# Patient Record
Sex: Female | Born: 1940 | Race: White | Hispanic: No | Marital: Married | Smoking: Never smoker
Health system: Southern US, Community
[De-identification: ages and names within clinical notes are randomized; demographics above are authoritative.]

## PROBLEM LIST (undated history)

## (undated) DIAGNOSIS — E785 Hyperlipidemia, unspecified: Secondary | ICD-10-CM

## (undated) DIAGNOSIS — I10 Essential (primary) hypertension: Secondary | ICD-10-CM

## (undated) HISTORY — PX: LAPAROSCOPIC HYSTERECTOMY: SHX1926

## (undated) HISTORY — DX: Hyperlipidemia, unspecified: E78.5

## (undated) HISTORY — DX: Essential (primary) hypertension: I10

## (undated) HISTORY — PX: TOTAL HIP ARTHROPLASTY: SHX124

---

## 2019-10-17 ENCOUNTER — Other Ambulatory Visit (HOSPITAL_COMMUNITY): Payer: Self-pay | Admitting: Emergency Medicine

## 2019-10-17 DIAGNOSIS — Z1231 Encounter for screening mammogram for malignant neoplasm of breast: Secondary | ICD-10-CM

## 2019-10-27 ENCOUNTER — Encounter: Payer: Self-pay | Admitting: Internal Medicine

## 2019-11-02 ENCOUNTER — Ambulatory Visit (HOSPITAL_COMMUNITY): Payer: Medicare HMO

## 2019-12-01 ENCOUNTER — Ambulatory Visit: Payer: Medicare HMO | Admitting: Internal Medicine

## 2019-12-01 ENCOUNTER — Telehealth: Payer: Self-pay | Admitting: *Deleted

## 2019-12-01 ENCOUNTER — Other Ambulatory Visit: Payer: Self-pay

## 2019-12-01 ENCOUNTER — Encounter: Payer: Self-pay | Admitting: Internal Medicine

## 2019-12-01 DIAGNOSIS — D369 Benign neoplasm, unspecified site: Secondary | ICD-10-CM

## 2019-12-01 DIAGNOSIS — K5150 Left sided colitis without complications: Secondary | ICD-10-CM

## 2019-12-01 NOTE — Patient Instructions (Addendum)
Schedule colonoscopy today in office.   Further recommendations to follow.  At [facility] we value your feedback. You may receive a survey about your visit today. Please share your experience as we strive to create trusting relationships with our patients to provide genuine, compassionate, quality care.  We appreciate your understanding and patience as we review any laboratory studies, imaging, and other diagnostic tests that are ordered as we care for you. Our office policy is 5 business days for review of these results, and any emergent or urgent results are addressed in a timely manner for your best interest. If you do not hear from our office in 1 week, please contact us.   We also encourage the use of MyChart, which contains your medical information for your review as well. If you are not enrolled in this feature, an access code is on this after visit summary for your convenience. Thank you for allowing [place] to be involved in your care.  It was great to see you today!  I hope you have a great rest of your summer!

## 2019-12-01 NOTE — H&P (View-Only) (Signed)
Primary Care Physician:  Vidal Schwalbe, MD Primary Gastroenterologist:  Dr. Abbey Chatters  Chief Complaint  Patient presents with  . Colonoscopy    last tcs 2005; had colitis 07/2019-ok now    HPI:   Linda Baker is a 79 y.o. female who presents to the clinic today for evaluation by referral form her PCP Dr. Bartolo Darter.  Patient recently moved back to the area from [place] where she lived for nearly 8 years.  And April 2021 she had an incident of rectal bleeding.  She notes numerous bowel movements with blood and clots.  She presented to the ER in [place] where she had a CT performed that showed colitis extending from the splenic flexure to the sigmoid colon.  She was placed on antibiotics and her symptoms improved.  Patient denies any personal family history of inflammatory bowel disease.  She states since that episode she has been doing "great."  No further bleeding.  No abdominal pain.  Last colonoscopy was in 2005 where she had 2 adenomatous polyps removed with a recommended 5-year recall.  Denies any reflux or heartburn.  No dysphagia or odynophagia.  No regurgitation of partially foods.  No chronic NSAID use or history of H. pylori.  Otherwise she has no other complaints today.  Past Medical History:  Diagnosis Date  . Dyslipidemia   . HTN (hypertension)     Past Surgical History:  Procedure Laterality Date  . LAPAROSCOPIC HYSTERECTOMY    . TOTAL HIP ARTHROPLASTY      Current Outpatient Medications  Medication Sig Dispense Refill  . aspirin EC 81 MG tablet Take 81 mg by mouth daily. Swallow whole.    Marland Kitchen atorvastatin (LIPITOR) 20 MG tablet Take 20 mg by mouth daily.    . Coenzyme Q10 (CO Q 10 PO) Take by mouth daily.    Marland Kitchen lisinopril (ZESTRIL) 20 MG tablet Take 20 mg by mouth daily.    . Multiple Vitamin (MULTIVITAMIN) tablet Take 1 tablet by mouth daily.    . Probiotic Product (PROBIOTIC DAILY PO) Take by mouth daily.     No current facility-administered medications for this visit.     Allergies as of 12/01/2019  . (No Known Allergies)    History reviewed. No pertinent family history.  Social History   Socioeconomic History  . Marital status: Married    Spouse name: Not on file  . Number of children: Not on file  . Years of education: Not on file  . Highest education level: Not on file  Occupational History  . Not on file  Tobacco Use  . Smoking status: Never Smoker  . Smokeless tobacco: Never Used  Substance and Sexual Activity  . Alcohol use: Never  . Drug use: Never  . Sexual activity: Not on file  Other Topics Concern  . Not on file  Social History Narrative  . Not on file   Social Determinants of Health   Financial Resource Strain:   . Difficulty of Paying Living Expenses:   Food Insecurity:   . Worried About Charity fundraiser in the Last Year:   . Arboriculturist in the Last Year:   Transportation Needs:   . Film/video editor (Medical):   Marland Kitchen Lack of Transportation (Non-Medical):   Physical Activity:   . Days of Exercise per Week:   . Minutes of Exercise per Session:   Stress:   . Feeling of Stress :   Social Connections:   . Frequency of Communication  with Friends and Family:   . Frequency of Social Gatherings with Friends and Family:   . Attends Religious Services:   . Active Member of Clubs or Organizations:   . Attends Archivist Meetings:   Marland Kitchen Marital Status:   Intimate Partner Violence:   . Fear of Current or Ex-Partner:   . Emotionally Abused:   Marland Kitchen Physically Abused:   . Sexually Abused:     Subjective: Review of Systems  Constitutional: Negative for chills and fever.  HENT: Negative for congestion and hearing loss.   Eyes: Negative for blurred vision and double vision.  Respiratory: Negative for cough and shortness of breath.   Cardiovascular: Negative for chest pain and palpitations.  Gastrointestinal: Negative for abdominal pain, blood in stool, constipation, diarrhea, heartburn, melena and vomiting.   Genitourinary: Negative for dysuria and urgency.  Musculoskeletal: Negative for joint pain and myalgias.  Skin: Negative for itching and rash.  Neurological: Negative for dizziness and headaches.  Psychiatric/Behavioral: Negative for depression. The patient is not nervous/anxious.        Objective: BP (!) 146/90   Pulse 89   Temp (!) 97.3 F (36.3 C) (Temporal)   Ht 5\' 2"  (1.575 m)   Wt 189 lb 3.2 oz (85.8 kg)   BMI 34.61 kg/m  Physical Exam Constitutional:      Appearance: Normal appearance.  HENT:     Head: Normocephalic and atraumatic.  Eyes:     Extraocular Movements: Extraocular movements intact.     Conjunctiva/sclera: Conjunctivae normal.  Cardiovascular:     Rate and Rhythm: Normal rate and regular rhythm.  Pulmonary:     Effort: Pulmonary effort is normal.     Breath sounds: Normal breath sounds.  Abdominal:     General: Bowel sounds are normal.     Palpations: Abdomen is soft.  Musculoskeletal:        General: No swelling. Normal range of motion.     Cervical back: Normal range of motion and neck supple.  Skin:    General: Skin is warm and dry.     Coloration: Skin is not jaundiced.  Neurological:     General: No focal deficit present.     Mental Status: She is alert and oriented to person, place, and time.  Psychiatric:        Mood and Affect: Mood normal.        Behavior: Behavior normal.      Assessment: *Left-sided colitis-improved, etiology unclear.  Differential includes ischemic colitis, infectious colitis, underlying inflammatory bowel disease. *History of adenomatous polyps-due for surveillance colonoscopy  Plan: Will schedule for  diagnostic colonoscopy.The risks including infection, bleed, or perforation as well as benefits, limitations, alternatives and imponderables have been reviewed with the patient. Questions have been answered. All parties agreeable. Further recommendations to follow  12/01/2019 2:01 PM   Disclaimer: This note  was dictated with voice recognition software. Similar sounding words can inadvertently be transcribed and may not be corrected upon review.

## 2019-12-01 NOTE — Telephone Encounter (Signed)
Called spoke w/ patient. She is scheduled for TCS with Dr. Abbey Chatters, asa 2, 8/26 at 12:15pm. Covid test scheduled for 8/25 at 9:30am. Patient aware will mail prep instructions to her with this appt. Confirmed mailing address is correct.

## 2019-12-01 NOTE — Progress Notes (Signed)
Primary Care Physician:  Vidal Schwalbe, MD Primary Gastroenterologist:  Dr. Abbey Chatters  Chief Complaint  Patient presents with  . Colonoscopy    last tcs 2005; had colitis 07/2019-ok now    HPI:   Linda Baker is a 79 y.o. female who presents to the clinic today for evaluation by referral form her PCP Dr. Bartolo Darter.  Patient recently moved back to the area from [place] where she lived for nearly 8 years.  And April 2021 she had an incident of rectal bleeding.  She notes numerous bowel movements with blood and clots.  She presented to the ER in [place] where she had a CT performed that showed colitis extending from the splenic flexure to the sigmoid colon.  She was placed on antibiotics and her symptoms improved.  Patient denies any personal family history of inflammatory bowel disease.  She states since that episode she has been doing "great."  No further bleeding.  No abdominal pain.  Last colonoscopy was in 2005 where she had 2 adenomatous polyps removed with a recommended 5-year recall.  Denies any reflux or heartburn.  No dysphagia or odynophagia.  No regurgitation of partially foods.  No chronic NSAID use or history of H. pylori.  Otherwise she has no other complaints today.  Past Medical History:  Diagnosis Date  . Dyslipidemia   . HTN (hypertension)     Past Surgical History:  Procedure Laterality Date  . LAPAROSCOPIC HYSTERECTOMY    . TOTAL HIP ARTHROPLASTY      Current Outpatient Medications  Medication Sig Dispense Refill  . aspirin EC 81 MG tablet Take 81 mg by mouth daily. Swallow whole.    Marland Kitchen atorvastatin (LIPITOR) 20 MG tablet Take 20 mg by mouth daily.    . Coenzyme Q10 (CO Q 10 PO) Take by mouth daily.    Marland Kitchen lisinopril (ZESTRIL) 20 MG tablet Take 20 mg by mouth daily.    . Multiple Vitamin (MULTIVITAMIN) tablet Take 1 tablet by mouth daily.    . Probiotic Product (PROBIOTIC DAILY PO) Take by mouth daily.     No current facility-administered medications for this visit.     Allergies as of 12/01/2019  . (No Known Allergies)    History reviewed. No pertinent family history.  Social History   Socioeconomic History  . Marital status: Married    Spouse name: Not on file  . Number of children: Not on file  . Years of education: Not on file  . Highest education level: Not on file  Occupational History  . Not on file  Tobacco Use  . Smoking status: Never Smoker  . Smokeless tobacco: Never Used  Substance and Sexual Activity  . Alcohol use: Never  . Drug use: Never  . Sexual activity: Not on file  Other Topics Concern  . Not on file  Social History Narrative  . Not on file   Social Determinants of Health   Financial Resource Strain:   . Difficulty of Paying Living Expenses:   Food Insecurity:   . Worried About Charity fundraiser in the Last Year:   . Arboriculturist in the Last Year:   Transportation Needs:   . Film/video editor (Medical):   Marland Kitchen Lack of Transportation (Non-Medical):   Physical Activity:   . Days of Exercise per Week:   . Minutes of Exercise per Session:   Stress:   . Feeling of Stress :   Social Connections:   . Frequency of Communication  with Friends and Family:   . Frequency of Social Gatherings with Friends and Family:   . Attends Religious Services:   . Active Member of Clubs or Organizations:   . Attends Archivist Meetings:   Marland Kitchen Marital Status:   Intimate Partner Violence:   . Fear of Current or Ex-Partner:   . Emotionally Abused:   Marland Kitchen Physically Abused:   . Sexually Abused:     Subjective: Review of Systems  Constitutional: Negative for chills and fever.  HENT: Negative for congestion and hearing loss.   Eyes: Negative for blurred vision and double vision.  Respiratory: Negative for cough and shortness of breath.   Cardiovascular: Negative for chest pain and palpitations.  Gastrointestinal: Negative for abdominal pain, blood in stool, constipation, diarrhea, heartburn, melena and vomiting.   Genitourinary: Negative for dysuria and urgency.  Musculoskeletal: Negative for joint pain and myalgias.  Skin: Negative for itching and rash.  Neurological: Negative for dizziness and headaches.  Psychiatric/Behavioral: Negative for depression. The patient is not nervous/anxious.        Objective: BP (!) 146/90   Pulse 89   Temp (!) 97.3 F (36.3 C) (Temporal)   Ht 5\' 2"  (1.575 m)   Wt 189 lb 3.2 oz (85.8 kg)   BMI 34.61 kg/m  Physical Exam Constitutional:      Appearance: Normal appearance.  HENT:     Head: Normocephalic and atraumatic.  Eyes:     Extraocular Movements: Extraocular movements intact.     Conjunctiva/sclera: Conjunctivae normal.  Cardiovascular:     Rate and Rhythm: Normal rate and regular rhythm.  Pulmonary:     Effort: Pulmonary effort is normal.     Breath sounds: Normal breath sounds.  Abdominal:     General: Bowel sounds are normal.     Palpations: Abdomen is soft.  Musculoskeletal:        General: No swelling. Normal range of motion.     Cervical back: Normal range of motion and neck supple.  Skin:    General: Skin is warm and dry.     Coloration: Skin is not jaundiced.  Neurological:     General: No focal deficit present.     Mental Status: She is alert and oriented to person, place, and time.  Psychiatric:        Mood and Affect: Mood normal.        Behavior: Behavior normal.      Assessment: *Left-sided colitis-improved, etiology unclear.  Differential includes ischemic colitis, infectious colitis, underlying inflammatory bowel disease. *History of adenomatous polyps-due for surveillance colonoscopy  Plan: Will schedule for  diagnostic colonoscopy.The risks including infection, bleed, or perforation as well as benefits, limitations, alternatives and imponderables have been reviewed with the patient. Questions have been answered. All parties agreeable. Further recommendations to follow  12/01/2019 2:01 PM   Disclaimer: This note  was dictated with voice recognition software. Similar sounding words can inadvertently be transcribed and may not be corrected upon review.

## 2019-12-09 ENCOUNTER — Encounter (HOSPITAL_COMMUNITY)
Admission: RE | Admit: 2019-12-09 | Discharge: 2019-12-09 | Disposition: A | Discharge status: Home / Self Care | Payer: Medicare HMO | Source: Ambulatory Visit | Attending: Internal Medicine | Admitting: Internal Medicine

## 2019-12-09 ENCOUNTER — Other Ambulatory Visit: Payer: Self-pay

## 2019-12-12 ENCOUNTER — Telehealth: Payer: Self-pay

## 2019-12-12 NOTE — Telephone Encounter (Signed)
Called pt, TCS for 12/15/19 moved up to 10:00am. Advised pt to arrive at 8:30am. Endo scheduler informed.

## 2019-12-14 ENCOUNTER — Encounter (HOSPITAL_COMMUNITY)
Admission: RE | Admit: 2019-12-14 | Discharge: 2019-12-14 | Disposition: A | Discharge status: Home / Self Care | Payer: Medicare HMO | Source: Ambulatory Visit | Attending: Internal Medicine | Admitting: Internal Medicine

## 2019-12-14 ENCOUNTER — Other Ambulatory Visit: Payer: Self-pay

## 2019-12-14 ENCOUNTER — Other Ambulatory Visit (HOSPITAL_COMMUNITY)
Admission: RE | Admit: 2019-12-14 | Discharge: 2019-12-14 | Disposition: A | Discharge status: Home / Self Care | Payer: Medicare HMO | Source: Ambulatory Visit | Attending: Internal Medicine | Admitting: Internal Medicine

## 2019-12-14 DIAGNOSIS — Z20822 Contact with and (suspected) exposure to covid-19: Secondary | ICD-10-CM | POA: Insufficient documentation

## 2019-12-14 DIAGNOSIS — Z01818 Encounter for other preprocedural examination: Secondary | ICD-10-CM | POA: Diagnosis present

## 2019-12-14 DIAGNOSIS — Z01810 Encounter for preprocedural cardiovascular examination: Secondary | ICD-10-CM | POA: Insufficient documentation

## 2019-12-14 LAB — SARS CORONAVIRUS 2 (TAT 6-24 HRS): SARS Coronavirus 2: NEGATIVE

## 2019-12-15 ENCOUNTER — Other Ambulatory Visit: Payer: Self-pay

## 2019-12-15 ENCOUNTER — Ambulatory Visit (HOSPITAL_COMMUNITY): Payer: Medicare HMO | Admitting: Anesthesiology

## 2019-12-15 ENCOUNTER — Encounter (HOSPITAL_COMMUNITY)
Admission: RE | Disposition: A | Discharge status: Home / Self Care | Payer: Self-pay | Source: Home / Self Care | Attending: Internal Medicine

## 2019-12-15 ENCOUNTER — Ambulatory Visit (HOSPITAL_COMMUNITY)
Admission: RE | Admit: 2019-12-15 | Discharge: 2019-12-15 | Disposition: A | Discharge status: Home / Self Care | Payer: Medicare HMO | Attending: Internal Medicine | Admitting: Internal Medicine

## 2019-12-15 ENCOUNTER — Encounter (HOSPITAL_COMMUNITY): Payer: Self-pay

## 2019-12-15 DIAGNOSIS — R948 Abnormal results of function studies of other organs and systems: Secondary | ICD-10-CM | POA: Insufficient documentation

## 2019-12-15 DIAGNOSIS — E785 Hyperlipidemia, unspecified: Secondary | ICD-10-CM | POA: Diagnosis not present

## 2019-12-15 DIAGNOSIS — Z7982 Long term (current) use of aspirin: Secondary | ICD-10-CM | POA: Insufficient documentation

## 2019-12-15 DIAGNOSIS — Z79899 Other long term (current) drug therapy: Secondary | ICD-10-CM | POA: Insufficient documentation

## 2019-12-15 DIAGNOSIS — D123 Benign neoplasm of transverse colon: Secondary | ICD-10-CM | POA: Insufficient documentation

## 2019-12-15 DIAGNOSIS — I10 Essential (primary) hypertension: Secondary | ICD-10-CM | POA: Insufficient documentation

## 2019-12-15 DIAGNOSIS — K5730 Diverticulosis of large intestine without perforation or abscess without bleeding: Secondary | ICD-10-CM | POA: Insufficient documentation

## 2019-12-15 DIAGNOSIS — K635 Polyp of colon: Secondary | ICD-10-CM | POA: Diagnosis not present

## 2019-12-15 DIAGNOSIS — K648 Other hemorrhoids: Secondary | ICD-10-CM | POA: Insufficient documentation

## 2019-12-15 HISTORY — PX: COLONOSCOPY WITH PROPOFOL: SHX5780

## 2019-12-15 HISTORY — PX: BIOPSY: SHX5522

## 2019-12-15 SURGERY — COLONOSCOPY WITH PROPOFOL
Anesthesia: General

## 2019-12-15 MED ORDER — CHLORHEXIDINE GLUCONATE CLOTH 2 % EX PADS: 6.0000 | MEDICATED_PAD | Freq: Once | CUTANEOUS | Status: DC

## 2019-12-15 MED ORDER — STERILE WATER FOR IRRIGATION IR SOLN
Status: DC | PRN
  Administered 2019-12-15: 1.5 mL

## 2019-12-15 MED ORDER — PROPOFOL 500 MG/50ML IV EMUL
INTRAVENOUS | Status: DC | PRN
  Administered 2019-12-15: 150 ug/kg/min via INTRAVENOUS
  Administered 2019-12-15: 80 mg via INTRAVENOUS
  Administered 2019-12-15: 60 mg via INTRAVENOUS

## 2019-12-15 MED ORDER — LACTATED RINGERS IV SOLN
INTRAVENOUS | Status: DC
  Administered 2019-12-15: 10 mL/h via INTRAVENOUS

## 2019-12-15 NOTE — Transfer of Care (Signed)
Immediate Anesthesia Transfer of Care Note  Patient: Linda Baker  Procedure(s) Performed: COLONOSCOPY WITH PROPOFOL (N/A ) BIOPSY  Patient Location: Endoscopy Unit  Anesthesia Type:General  Level of Consciousness: awake, alert , oriented and patient cooperative  Airway & Oxygen Therapy: Patient Spontanous Breathing and Patient connected to nasal cannula oxygen  Post-op Assessment: Report given to RN, Post -op Vital signs reviewed and stable and Patient moving all extremities  Post vital signs: Reviewed and stable  Last Vitals:  Vitals Value Taken Time  BP 102/61 12/15/19 0956  Temp    Pulse 80 12/15/19 0956  Resp 17 12/15/19 0956  SpO2 97 % 12/15/19 0956    Last Pain:  Vitals:   12/15/19 0956  TempSrc:   PainSc: 0-No pain      Patients Stated Pain Goal: 8 (62/26/33 [id number])  Complications: No complications documented.

## 2019-12-15 NOTE — Anesthesia Postprocedure Evaluation (Signed)
Anesthesia Post Note  Patient: Linda Baker  Procedure(s) Performed: COLONOSCOPY WITH PROPOFOL (N/A ) BIOPSY  Patient location during evaluation: Endoscopy Anesthesia Type: General Level of consciousness: awake, oriented, awake and alert and patient cooperative Pain management: pain level controlled Vital Signs Assessment: post-procedure vital signs reviewed and stable Respiratory status: spontaneous breathing, respiratory function stable and nonlabored ventilation Cardiovascular status: blood pressure returned to baseline and stable Postop Assessment: no headache and no backache   No complications documented.   Last Vitals:  Vitals:   12/15/19 0845 12/15/19 0956  BP: 138/83 102/61  Pulse: 80 80  Resp: 20 17  Temp: 37.1 C   SpO2: 96% 97%    Last Pain:  Vitals:   12/15/19 0956  TempSrc:   PainSc: 0-No pain                 Tacy Learn

## 2019-12-15 NOTE — Interval H&P Note (Signed)
History and Physical Interval Note:  12/15/2019 8:55 AM  Linda Baker  has presented today for surgery, with the diagnosis of adenamatous polyps, left sided colitis.  The various methods of treatment have been discussed with the patient and family. After consideration of risks, benefits and other options for treatment, the patient has consented to  Procedure(s) with comments: COLONOSCOPY WITH PROPOFOL (N/A) - 12:15pm as a surgical intervention.  The patient's history has been reviewed, patient examined, no change in status, stable for surgery.  I have reviewed the patient's chart and labs.  Questions were answered to the patient's satisfaction.     Eloise Harman

## 2019-12-15 NOTE — Anesthesia Preprocedure Evaluation (Signed)
Anesthesia Evaluation  Patient identified by MRN, date of birth, ID band Patient awake    Reviewed: Allergy & Precautions, H&P , NPO status , Patient's Chart, lab work & pertinent test results, reviewed documented beta blocker date and time   Airway Mallampati: II  TM Distance: >3 FB Neck ROM: full    Dental no notable dental hx. (+) Teeth Intact   Pulmonary neg pulmonary ROS,    Pulmonary exam normal breath sounds clear to auscultation       Cardiovascular Exercise Tolerance: Good hypertension, negative cardio ROS   Rhythm:regular Rate:Normal     Neuro/Psych negative neurological ROS  negative psych ROS   GI/Hepatic negative GI ROS, Neg liver ROS,   Endo/Other  negative endocrine ROS  Renal/GU negative Renal ROS  negative genitourinary   Musculoskeletal   Abdominal   Peds  Hematology negative hematology ROS (+)   Anesthesia Other Findings   Reproductive/Obstetrics negative OB ROS                             Anesthesia Physical Anesthesia Plan  ASA: II  Anesthesia Plan: General   Post-op Pain Management:    Induction:   PONV Risk Score and Plan: Propofol infusion  Airway Management Planned:   Additional Equipment:   Intra-op Plan:   Post-operative Plan:   Informed Consent: I have reviewed the patients History and Physical, chart, labs and discussed the procedure including the risks, benefits and alternatives for the proposed anesthesia with the patient or authorized representative who has indicated his/her understanding and acceptance.     Dental Advisory Given  Plan Discussed with: CRNA  Anesthesia Plan Comments:         Anesthesia Quick Evaluation  

## 2019-12-15 NOTE — Op Note (Signed)
[facility] Patient Name: Linda Baker Procedure Date: 12/15/2019 9:27 AM MRN: [id number] Date of Birth: 08-18-1940 Attending MD: Elon Alas. Abbey Chatters DO CSN: [id number] Age: 79 Admit Type: Outpatient Procedure:                Colonoscopy Indications:              Abnormal CT of the GI tract Providers:                Elon Alas. Abbey Chatters, DO, Janeece Riggers, RN, Lambert Mody, Nelma Rothman, Technician, Casimer Bilis, Technician Referring MD:              Medicines:                See the Anesthesia note for documentation of the                            administered medications Complications:            No immediate complications. Estimated Blood Loss:     Estimated blood loss was minimal. Procedure:                Pre-Anesthesia Assessment:                           - The anesthesia plan was to use monitored                            anesthesia care (MAC).                           After obtaining informed consent, the colonoscope                            was passed under direct vision. Throughout the                            procedure, the patient's blood pressure, pulse, and                            oxygen saturations were monitored continuously. The                            [id number]-[id number] ([id number]) scope was introduced through                            the anus and advanced to the the cecum, identified                            by appendiceal orifice and ileocecal valve. The                            colonoscopy was performed without difficulty.  The                            patient tolerated the procedure well. The quality                            of the bowel preparation was evaluated using the                            BBPS [facility] Bowel Preparation Scale) with scores                            of: Right Colon = 3, Transverse Colon = 3 and Left                            Colon = 3 (entire mucosa seen well  with no residual                            staining, small fragments of stool or opaque                            liquid). The total BBPS score equals 9. Scope In: 9:43:10 AM Scope Out: 9:53:30 AM Scope Withdrawal Time: 0 hours 6 minutes 19 seconds  Total Procedure Duration: 0 hours 10 minutes 20 seconds  Findings:      The perianal and digital rectal examinations were normal.      Non-bleeding internal hemorrhoids were found during endoscopy.      Multiple small-mouthed diverticula were found in the sigmoid colon.      Two sessile polyps were found in the transverse colon. The polyps were 1       to 2 mm in size. These polyps were removed with a jumbo cold forceps.       Resection and retrieval were complete.      The exam was otherwise without abnormality. Impression:               - Non-bleeding internal hemorrhoids.                           - Diverticulosis in the sigmoid colon.                           - Two 1 to 2 mm polyps in the transverse colon,                            removed with a jumbo cold forceps. Resected and                            retrieved.                           - The examination was otherwise normal. Moderate Sedation:      Per Anesthesia Care Recommendation:           - Patient has a contact number available for  emergencies. The signs and symptoms of potential                            delayed complications were discussed with the                            patient. Return to normal activities tomorrow.                            Written discharge instructions were provided to the                            patient.                           - Continue present medications.                           - Resume previous diet.                           - Await pathology results.                           - No repeat colonoscopy due to age.                           - Return to GI clinic PRN. Procedure Code(s):        ---  Professional ---                           [id number], Colonoscopy, flexible; with biopsy, single                            or multiple Diagnosis Code(s):        --- Professional ---                           K64.8, Other hemorrhoids                           K63.5, Polyp of colon                           K57.30, Diverticulosis of large intestine without                            perforation or abscess without bleeding                           R93.3, Abnormal findings on diagnostic imaging of                            other parts of digestive tract CPT copyright 2019 American Medical Association. All rights reserved. The codes documented in this report are preliminary and upon coder review may  be revised to meet current compliance requirements. Elon Alas. Abbey Chatters, [place]  Abbey Chatters, DO 12/15/2019 9:58:41 AM This report has been signed electronically. Number of Addenda: 0

## 2019-12-15 NOTE — Discharge Instructions (Addendum)
Colonoscopy Discharge Instructions  Read the instructions outlined below and refer to this sheet in the next few weeks. These discharge instructions provide you with general information on caring for yourself after you leave the hospital. Your doctor may also give you specific instructions. While your treatment has been planned according to the most current medical practices available, unavoidable complications occasionally occur.   ACTIVITY  You may resume your regular activity, but move at a slower pace for the next 24 hours.   Take frequent rest periods for the next 24 hours.   Walking will help get rid of the air and reduce the bloated feeling in your belly (abdomen).   No driving for 24 hours (because of the medicine (anesthesia) used during the test).    Do not sign any important legal documents or operate any machinery for 24 hours (because of the anesthesia used during the test).  NUTRITION  Drink plenty of fluids.   You may resume your normal diet as instructed by your doctor.   Begin with a light meal and progress to your normal diet. Heavy or fried foods are harder to digest and may make you feel sick to your stomach (nauseated).   Avoid alcoholic beverages for 24 hours or as instructed.  MEDICATIONS  You may resume your normal medications unless your doctor tells you otherwise.  WHAT YOU CAN EXPECT TODAY  Some feelings of bloating in the abdomen.   Passage of more gas than usual.   Spotting of blood in your stool or on the toilet paper.  IF YOU HAD POLYPS REMOVED DURING THE COLONOSCOPY:  No aspirin products for 7 days or as instructed.   No alcohol for 7 days or as instructed.   Eat a soft diet for the next 24 hours.  FINDING OUT THE RESULTS OF YOUR TEST Not all test results are available during your visit. If your test results are not back during the visit, make an appointment with your caregiver to find out the results. Do not assume everything is normal if  you have not heard from your caregiver or the medical facility. It is important for you to follow up on all of your test results.  SEEK IMMEDIATE MEDICAL ATTENTION IF:  You have more than a spotting of blood in your stool.   Your belly is swollen (abdominal distention).   You are nauseated or vomiting.   You have a temperature over 101.   You have abdominal pain or discomfort that is severe or gets worse throughout the day.    Diverticulosis  Diverticulosis is a condition that develops when small pouches (diverticula) form in the wall of the large intestine (colon). The colon is where water is absorbed and stool (feces) is formed. The pouches form when the inside layer of the colon pushes through weak spots in the outer layers of the colon. You may have a few pouches or many of them. The pouches usually do not cause problems unless they become inflamed or infected. When this happens, the condition is called diverticulitis. What are the causes? The cause of this condition is not known. What increases the risk? The following factors may make you more likely to develop this condition:  Being older than age 78. Your risk for this condition increases with age. Diverticulosis is rare among people younger than age 78. By age 78, many people have it.  Eating a low-fiber diet.  Having frequent constipation.  Being overweight.  Not getting enough exercise.  Smoking.  Taking over-the-counter pain medicines, like aspirin and ibuprofen.  Having a family history of diverticulosis. What are the signs or symptoms? In most people, there are no symptoms of this condition. If you do have symptoms, they may include:  Bloating.  Cramps in the abdomen.  Constipation or diarrhea.  Pain in the lower left side of the abdomen. How is this diagnosed? Because diverticulosis usually has no symptoms, it is most often diagnosed during an exam for other colon problems. The condition may be diagnosed  by:  Using a flexible scope to examine the colon (colonoscopy).  Taking an X-ray of the colon after dye has been put into the colon (barium enema).  Having a CT scan. How is this treated? You may not need treatment for this condition. Your health care provider may recommend treatment to prevent problems. You may need treatment if you have symptoms or if you previously had diverticulitis. Treatment may include:  Eating a high-fiber diet.  Taking a fiber supplement.  Taking a live bacteria supplement (probiotic).  Taking medicine to relax your colon. Follow these instructions at home: Medicines  Take over-the-counter and prescription medicines only as told by your health care provider.  If told by your health care provider, take a fiber supplement or probiotic. Constipation prevention Your condition may cause constipation. To prevent or treat constipation, you may need to:  Drink enough fluid to keep your urine pale yellow.  Take over-the-counter or prescription medicines.  Eat foods that are high in fiber, such as beans, whole grains, and fresh fruits and vegetables.  Limit foods that are high in fat and processed sugars, such as fried or sweet foods.  General instructions  Try not to strain when you have a bowel movement.  Keep all follow-up visits as told by your health care provider. This is important. Contact a health care provider if you:  Have pain in your abdomen.  Have bloating.  Have cramps.  Have not had a bowel movement in 3 days. Get help right away if:  Your pain gets worse.  Your bloating becomes very bad.  You have a fever or chills, and your symptoms suddenly get worse.  You vomit.  You have bowel movements that are bloody or black.  You have bleeding from your rectum. Summary  Diverticulosis is a condition that develops when small pouches (diverticula) form in the wall of the large intestine (colon).  You may have a few pouches or many  of them.  This condition is most often diagnosed during an exam for other colon problems.  Treatment may include increasing the fiber in your diet, taking supplements, or taking medicines. This information is not intended to replace advice given to you by your health care provider. Make sure you discuss any questions you have with your health care provider. Document Revised: 11/04/2018 Document Reviewed: 11/04/2018 Elsevier Patient Education  [place].  Colon Polyps  Polyps are tissue growths inside the body. Polyps can grow in many places, including the large intestine (colon). A polyp may be a round bump or a mushroom-shaped growth. You could have one polyp or several. Most colon polyps are noncancerous (benign). However, some colon polyps can become cancerous over time. Finding and removing the polyps early can help prevent this. What are the causes? The exact cause of colon polyps is not known. What increases the risk? You are more likely to develop this condition if you:  Have a family history of colon cancer or colon polyps.  Are older than 28 or older than 45 if you are African American.  Have inflammatory bowel disease, such as ulcerative colitis or Crohn's disease.  Have certain hereditary conditions, such as: ? Familial adenomatous polyposis. ? Lynch syndrome. ? Turcot syndrome. ? Peutz-Jeghers syndrome.  Are overweight.  Smoke cigarettes.  Do not get enough exercise.  Drink too much alcohol.  Eat a diet that is high in fat and red meat and low in fiber.  Had childhood cancer that was treated with abdominal radiation. What are the signs or symptoms? Most polyps do not cause symptoms. If you have symptoms, they may include:  Blood coming from your rectum when having a bowel movement.  Blood in your stool. The stool may look dark red or black.  Abdominal pain.  A change in bowel habits, such as constipation or diarrhea. How is this  diagnosed? This condition is diagnosed with a colonoscopy. This is a procedure in which a lighted, flexible scope is inserted into the anus and then passed into the colon to examine the area. Polyps are sometimes found when a colonoscopy is done as part of routine cancer screening tests. How is this treated? Treatment for this condition involves removing any polyps that are found. Most polyps can be removed during a colonoscopy. Those polyps will then be tested for cancer. Additional treatment may be needed depending on the results of testing. Follow these instructions at home: Lifestyle  Maintain a healthy weight, or lose weight if recommended by your health care provider.  Exercise every day or as told by your health care provider.  Do not use any products that contain nicotine or tobacco, such as cigarettes and e-cigarettes. If you need help quitting, ask your health care provider.  If you drink alcohol, limit how much you have: ? 0-1 drink a day for women. ? 0-2 drinks a day for men.  Be aware of how much alcohol is in your drink. In the [place] one drink equals one 12 oz bottle of beer (355 mL), one 5 oz glass of wine (148 mL), or one 1 oz shot of hard liquor (44 mL). Eating and drinking   Eat foods that are high in fiber, such as fruits, vegetables, and whole grains.  Eat foods that are high in calcium and vitamin D, such as milk, cheese, yogurt, eggs, liver, fish, and broccoli.  Limit foods that are high in fat, such as fried foods and desserts.  Limit the amount of red meat and processed meat you eat, such as hot dogs, sausage, bacon, and lunch meats. General instructions  Keep all follow-up visits as told by your health care provider. This is important. ? This includes having regularly scheduled colonoscopies. ? Talk to your health care provider about when you need a colonoscopy. Contact a health care provider if:  You have new or worsening bleeding during a bowel  movement.  You have new or increased blood in your stool.  You have a change in bowel habits.  You lose weight for no known reason. Summary  Polyps are tissue growths inside the body. Polyps can grow in many places, including the colon.  Most colon polyps are noncancerous (benign), but some can become cancerous over time.  This condition is diagnosed with a colonoscopy.  Treatment for this condition involves removing any polyps that are found. Most polyps can be removed during a colonoscopy. This information is not intended to replace advice given to you by your health care provider. Make sure  you discuss any questions you have with your health care provider. Document Revised: 07/23/2017 Document Reviewed: 07/23/2017 Elsevier Patient Education  [place].   Your colonoscopy was relatively unremarkable besides diverticulosis and small hemorrhoids.  I did remove 2 very small polyps.  Await pathology results (my office will contact you next week).  Follow-up with GI as needed.  I hope you have a great rest of your week!  Elon Alas. Abbey Chatters, D.O. Gastroenterology and Hepatology [facility]

## 2019-12-16 LAB — SURGICAL PATHOLOGY

## 2019-12-19 ENCOUNTER — Encounter (HOSPITAL_COMMUNITY): Payer: Self-pay | Admitting: Internal Medicine

## 2021-02-11 ENCOUNTER — Other Ambulatory Visit: Payer: Self-pay | Admitting: Emergency Medicine

## 2021-02-11 ENCOUNTER — Other Ambulatory Visit (HOSPITAL_COMMUNITY): Payer: Self-pay | Admitting: Emergency Medicine

## 2021-02-11 DIAGNOSIS — G459 Transient cerebral ischemic attack, unspecified: Secondary | ICD-10-CM

## 2021-02-12 ENCOUNTER — Other Ambulatory Visit: Payer: Self-pay

## 2021-02-12 ENCOUNTER — Ambulatory Visit (HOSPITAL_COMMUNITY)
Admission: RE | Admit: 2021-02-12 | Discharge: 2021-02-12 | Disposition: A | Discharge status: Home / Self Care | Payer: Medicare HMO | Source: Ambulatory Visit | Attending: Emergency Medicine | Admitting: Emergency Medicine

## 2021-02-12 ENCOUNTER — Emergency Department (HOSPITAL_COMMUNITY)
Admission: EM | Admit: 2021-02-12 | Discharge: 2021-02-12 | Disposition: A | Discharge status: Home / Self Care | Payer: Medicare HMO | Attending: Emergency Medicine | Admitting: Emergency Medicine

## 2021-02-12 ENCOUNTER — Encounter (HOSPITAL_COMMUNITY): Payer: Self-pay | Admitting: *Deleted

## 2021-02-12 DIAGNOSIS — Z79899 Other long term (current) drug therapy: Secondary | ICD-10-CM | POA: Insufficient documentation

## 2021-02-12 DIAGNOSIS — Z7982 Long term (current) use of aspirin: Secondary | ICD-10-CM | POA: Insufficient documentation

## 2021-02-12 DIAGNOSIS — G459 Transient cerebral ischemic attack, unspecified: Secondary | ICD-10-CM | POA: Insufficient documentation

## 2021-02-12 DIAGNOSIS — Z96649 Presence of unspecified artificial hip joint: Secondary | ICD-10-CM | POA: Diagnosis not present

## 2021-02-12 DIAGNOSIS — R42 Dizziness and giddiness: Secondary | ICD-10-CM | POA: Diagnosis present

## 2021-02-12 DIAGNOSIS — I10 Essential (primary) hypertension: Secondary | ICD-10-CM | POA: Diagnosis not present

## 2021-02-12 DIAGNOSIS — I6523 Occlusion and stenosis of bilateral carotid arteries: Secondary | ICD-10-CM | POA: Insufficient documentation

## 2021-02-12 LAB — CBC
HCT: 44.7 % (ref 36.0–46.0)
Hemoglobin: 14.9 g/dL (ref 12.0–15.0)
MCH: 32 pg (ref 26.0–34.0)
MCHC: 33.3 g/dL (ref 30.0–36.0)
MCV: 95.9 fL (ref 80.0–100.0)
Platelets: 252 10*3/uL (ref 150–400)
RBC: 4.66 MIL/uL (ref 3.87–5.11)
RDW: 13.5 % (ref 11.5–15.5)
WBC: 9.9 10*3/uL (ref 4.0–10.5)
nRBC: 0 % (ref 0.0–0.2)

## 2021-02-12 LAB — BASIC METABOLIC PANEL
Anion gap: 11 (ref 5–15)
BUN: 15 mg/dL (ref 8–23)
CO2: 22 mmol/L (ref 22–32)
Calcium: 9.6 mg/dL (ref 8.9–10.3)
Chloride: 103 mmol/L (ref 98–111)
Creatinine, Ser: 0.72 mg/dL (ref 0.44–1.00)
GFR, Estimated: >60 mL/min (ref >60)
Glucose, Bld: 132 mg/dL — ABNORMAL HIGH (ref 70–99)
Potassium: 4.1 mmol/L (ref 3.5–5.1)
Sodium: 136 mmol/L (ref 135–145)

## 2021-02-12 MED ORDER — MECLIZINE HCL 12.5 MG PO TABS
25.0000 mg | ORAL_TABLET | Freq: Once | ORAL | Status: AC
  Administered 2021-02-12: 25 mg via ORAL
  Filled 2021-02-12: qty 2

## 2021-02-12 MED ORDER — MECLIZINE HCL 25 MG PO TABS: 25.0000 mg | ORAL_TABLET | Freq: Three times a day (TID) | ORAL | 0 refills | Status: AC | PRN

## 2021-02-12 NOTE — ED Provider Notes (Signed)
[facility] EMERGENCY DEPARTMENT Provider Note   CSN: [id number] Arrival date & time: 02/12/21  1219     History Chief Complaint  Patient presents with   Dizziness    Linda Baker is a 80 y.o. female.  Patient had an MRI and ultrasounds of her carotids and vertebral arteries today.  After the MRI she started getting dizzy.  Patient felt like the room was spinning  The history is provided by the patient and medical records. No language interpreter was used.  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     Family History  Problem Relation Age of Onset   Hypertension Mother    Pancreatic cancer Father     Social History   Tobacco Use   Smoking status: Never   Smokeless tobacco: Never  Substance Use Topics   Alcohol use: Never   Drug use: Never    Home Medications Prior to Admission medications   Medication Sig Start Date End Date Taking? Authorizing Provider  meclizine (ANTIVERT) 25 MG tablet Take 1 tablet (25 mg total) by  mouth 3 (three) times daily as needed for dizziness. 02/12/21  Yes Milton Ferguson, MD  aspirin EC 81 MG tablet Take 81 mg by mouth daily. Swallow whole.    [provider]  atorvastatin (LIPITOR) 20 MG tablet Take 20 mg by mouth daily. 10/22/19   [provider]  Coenzyme Q10 (CO Q 10) 100 MG CAPS Take 100 mg by mouth daily.     [provider]  lisinopril (ZESTRIL) 20 MG tablet Take 20 mg by mouth daily. 10/09/19   [provider]  Multiple Vitamin (MULTIVITAMIN) tablet Take 1 tablet by mouth daily.    [provider]  Probiotic Product (PROBIOTIC DAILY PO) Take 1 capsule by mouth daily.     [provider]    Allergies    Patient has no known allergies.  Review of Systems   Review of Systems  Constitutional:  Negative for appetite change and fatigue.  HENT:  Negative for congestion, ear discharge and sinus pressure.   Eyes:  Negative for discharge.  Respiratory:  Negative for cough.   Cardiovascular:  Negative for chest pain.  Gastrointestinal:  Negative for abdominal pain and diarrhea.  Genitourinary:  Negative for frequency and hematuria.  Musculoskeletal:  Negative for back pain.  Skin:  Negative for rash.  Neurological:  Positive for dizziness. Negative for seizures and headaches.  Psychiatric/Behavioral:  Negative for hallucinations.    Physical Exam Updated Vital Signs BP 132/86   Pulse 74   Temp 98 F (36.7 C) (Oral)   Resp 18   SpO2 99%   Physical Exam Vitals and nursing note reviewed.  Constitutional:      Appearance: She is well-developed.  HENT:     Head: Normocephalic.     Nose: Nose normal.  Eyes:     General: No scleral icterus.    Conjunctiva/sclera: Conjunctivae normal.  Neck:     Thyroid: No thyromegaly.  Cardiovascular:     Rate and Rhythm: Normal rate and regular rhythm.     Heart sounds: No murmur heard.   No friction rub. No gallop.  Pulmonary:     Breath sounds: No stridor. No wheezing or  rales.  Chest:     Chest wall: No tenderness.  Abdominal:     General: There is no distension.     Tenderness: There is no abdominal tenderness. There is no rebound.  Musculoskeletal:        General: Normal range of motion.     Cervical back: Neck supple.  Lymphadenopathy:     Cervical: No cervical adenopathy.  Skin:    Findings: No erythema or rash.  Neurological:     Mental Status: She is alert and oriented to person, place, and time.     Motor: No abnormal muscle tone.     Coordination: Coordination normal.  Psychiatric:        Behavior: Behavior normal.    ED Results / Procedures / Treatments   Labs (all labs ordered are listed, but only abnormal results are displayed) Labs Reviewed  BASIC METABOLIC PANEL - Abnormal; Notable for the following components:      Result Value   Glucose, Bld 132 (*)    All other components within normal limits  CBC  URINALYSIS, ROUTINE W REFLEX MICROSCOPIC  CBG MONITORING, ED    EKG None  Radiology MR BRAIN WO CONTRAST  Result Date: 02/12/2021 CLINICAL DATA:  Transient ischemic attack. Additional history provided: Weakness and confusion for 2 weeks. EXAM: MRI HEAD WITHOUT CONTRAST TECHNIQUE: Multiplanar, multiecho pulse sequences of the brain and surrounding structures were obtained without intravenous contrast. COMPARISON:  No pertinent prior exams available for comparison. FINDINGS: Brain: Mild for age generalized cerebral and cerebellar atrophy. Prominent perivascular spaces within the deep gray nuclei bilaterally. Superimposed chronic lacunar infarcts within the bilateral basal ganglia and thalami. Moderate multifocal T2 FLAIR hyperintense signal abnormality within the cerebral white matter, nonspecific but compatible chronic small vessel ischemic disease. Mild chronic small vessel ischemic changes are also present within the pons. Prominent perivascular space versus chronic lacunar infarct within the left midbrain (series 10, image 9).  There is no acute infarct. No evidence of an intracranial mass. No chronic intracranial blood products. No extra-axial fluid collection. No midline shift. Vascular: Maintained flow voids within the proximal large arterial vessels. Skull and upper cervical spine: No focal suspicious marrow lesion. Trace C3-C4 grade 1 anterolisthesis. Trace C4-C5 grade 1 retrolisthesis. Sinuses/Orbits: Visualized orbits show no acute finding. Right lens replacement. Mucosal thickening within the bilateral ethmoid and left maxillary sinuses at the imaged levels. IMPRESSION: No evidence of acute intracranial abnormality. 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 A small chronic lacunar infarct is also questioned within the left midbrain. Background chronic small vessel ischemic changes which are moderate in the cerebral white matter, and mild in the pons. Mild for age generalized  parenchymal atrophy. Mild paranasal sinus disease at the imaged levels, as described. Electronically Signed   By: Kellie Simmering D.O.   On: 02/12/2021 11:55   US Carotid Bilateral  Result Date: 02/12/2021 CLINICAL DATA:  Transient ischemic attacks, hypertension, hyperlipidemia EXAM: BILATERAL CAROTID DUPLEX ULTRASOUND TECHNIQUE: Pearline Cables scale imaging, color Doppler and duplex ultrasound were performed of bilateral carotid and vertebral arteries in the neck. COMPARISON:  None. FINDINGS: Criteria: Quantification of carotid stenosis is based on velocity parameters that correlate the residual internal carotid diameter with NASCET-based stenosis levels, using the diameter of the distal internal carotid lumen as the denominator for stenosis measurement. The following velocity measurements were obtained: RIGHT ICA: 67/23 cm/sec CCA: 10/93 cm/sec SYSTOLIC ICA/CCA RATIO:  0.9 ECA: 72 cm/sec LEFT ICA: 74/23 cm/sec CCA: 23/55 cm/sec SYSTOLIC ICA/CCA RATIO:  1.2 ECA: 51 cm/sec RIGHT CAROTID ARTERY: Minor intimal thickening and trace  hypoechoic plaque formation. No hemodynamically significant right ICA stenosis, velocity elevation, or turbulent flow. Degree of narrowing less than 50%. RIGHT VERTEBRAL ARTERY:  Normal antegrade flow LEFT CAROTID ARTERY: Similar intimal thickening and trace hypoechoic plaque formation. No hemodynamically significant left ICA stenosis, velocity elevation, or turbulent flow. LEFT VERTEBRAL ARTERY:  Normal antegrade flow IMPRESSION: Mild bilateral carotid atherosclerosis. Negative for stenosis. Degree of narrowing less than 50% bilaterally by ultrasound criteria. Patent antegrade vertebral flow bilaterally Electronically Signed   By: Jerilynn Mages.  Shick M.D.   On: 02/12/2021 12:20    Procedures Procedures   Medications Ordered in ED Medications  meclizine (ANTIVERT) tablet 25 mg (25 mg Oral Given 02/12/21 1850)    ED Course  I have reviewed the triage vital signs and the nursing notes.  Pertinent labs & imaging results that were available during my care of the patient were reviewed by me and considered in my medical decision making (see chart for details). MRI shows no acute disease and ultrasound of her vertebral arteries are  negative and moderate disease to the carotids   MDM Rules/Calculators/A&P                           Patient with vertigo symptoms, she improved with Antivert and will follow up with PCP. Final Clinical Impression(s) / ED Diagnoses Final diagnoses:  Vertigo    Rx / DC Orders ED Discharge Orders          Ordered    meclizine (ANTIVERT) 25 MG tablet  3 times daily PRN        02/12/21 1948             Milton Ferguson, MD 02/16/21 1200

## 2021-02-12 NOTE — Discharge Instructions (Signed)
follow-up with your family doctor if not improving

## 2021-02-12 NOTE — ED Provider Notes (Signed)
Emergency Medicine Provider Triage Evaluation Note  Linda Baker , a 80 y.o. female  was evaluated in triage.  Pt complains of dizziness that started 6 hours PTA after pt received an MRI. States she had the mri as she had an episode of left facial droop, vision changes and aphasia 6 days ago. Her pcp sent her for mri and carotid US due to concern for tia.   States dizziness is not continuous. It is only present with movement of her head. Denies vision changes, numbness, weakness  Review of Systems  Positive: Dizziness,  Negative: vision changes, numbness, weakness  Physical Exam  BP 139/85 (BP Location: Left Arm)   Pulse 84   Temp 98 F (36.7 C) (Oral)   Resp 15   SpO2 95%  Gen:   Awake, no distress   Resp:  Normal effort  MSK:   Moves extremities without difficulty  Other:  Cn II-XII intact, 5/5 strength to the bue/ble, normal finger to nose bilat, neg pronator drift  Medical Decision Making  Medically screening exam initiated at 5:49 PM.  Appropriate orders placed.  Tiffany Kocher was informed that the remainder of the evaluation will be completed by another provider, this initial triage assessment does not replace that evaluation, and the importance of remaining in the ED until their evaluation is complete.     Bishop Dublin 02/12/21 1756    Milton Ferguson, MD 02/16/21 1200

## 2021-02-12 NOTE — ED Triage Notes (Signed)
PATIENT BROUGHT OVER FROM XRAY AFTER AND MRI OF THE BRAIN, C/O DIZZINESS AFTER MRI

## 2021-02-15 ENCOUNTER — Other Ambulatory Visit (HOSPITAL_COMMUNITY): Payer: Self-pay | Admitting: Emergency Medicine

## 2021-02-15 DIAGNOSIS — G459 Transient cerebral ischemic attack, unspecified: Secondary | ICD-10-CM

## 2021-02-18 ENCOUNTER — Ambulatory Visit (HOSPITAL_COMMUNITY)
Admission: RE | Admit: 2021-02-18 | Discharge: 2021-02-18 | Disposition: A | Discharge status: Home / Self Care | Payer: Medicare HMO | Source: Ambulatory Visit | Attending: Emergency Medicine | Admitting: Emergency Medicine

## 2021-02-18 ENCOUNTER — Other Ambulatory Visit: Payer: Self-pay

## 2021-02-18 DIAGNOSIS — R42 Dizziness and giddiness: Secondary | ICD-10-CM | POA: Insufficient documentation

## 2021-02-18 DIAGNOSIS — I080 Rheumatic disorders of both mitral and aortic valves: Secondary | ICD-10-CM | POA: Diagnosis not present

## 2021-02-18 DIAGNOSIS — G459 Transient cerebral ischemic attack, unspecified: Secondary | ICD-10-CM | POA: Diagnosis not present

## 2021-02-18 DIAGNOSIS — I10 Essential (primary) hypertension: Secondary | ICD-10-CM | POA: Diagnosis not present

## 2021-02-18 DIAGNOSIS — E785 Hyperlipidemia, unspecified: Secondary | ICD-10-CM | POA: Insufficient documentation

## 2021-02-18 LAB — ECHOCARDIOGRAM COMPLETE
Area-P 1/2: 6.9 cm2
S' Lateral: 2.2 cm

## 2021-02-18 NOTE — Progress Notes (Signed)
  Echocardiogram 2D Echocardiogram has been performed.  Linda Baker 02/18/2021, 9:42 AM

## 2022-10-01 IMAGING — MR MR HEAD W/O CM
13 of 14 series · 36 of 48 positions shown · non-contrast
Comparison: No pertinent prior exams available for comparison.

CLINICAL DATA: Transient ischemic attack. Additional history
provided: Weakness and confusion for 2 weeks.

EXAM:
MRI HEAD WITHOUT CONTRAST
TECHNIQUE: Multiplanar, multiecho pulse sequences of the brain and surrounding
structures were obtained without intravenous contrast.

[Series 5: DWI · axial · 3.0mm · 0.78mm/px · z∈[-43,+98]mm · 3 of 48 slices shown (1 of 6)]
[im 1/48]
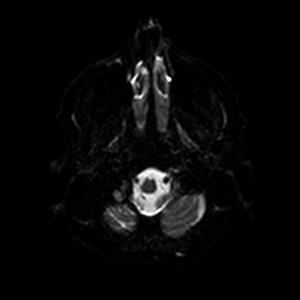
[im 24/48]
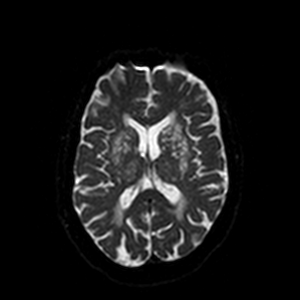
[im 48/48]
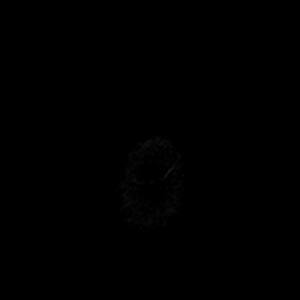

[Series 5: DWI · axial · 3.0mm · 0.78mm/px · z∈[-43,+98]mm · 3 of 48 slices shown (2 of 6)]
[im 1/48]
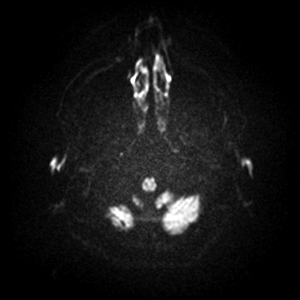
[im 24/48]
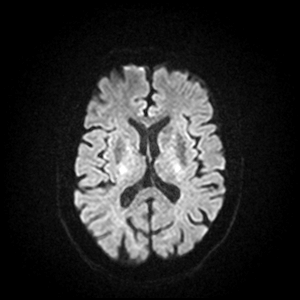
[im 48/48]
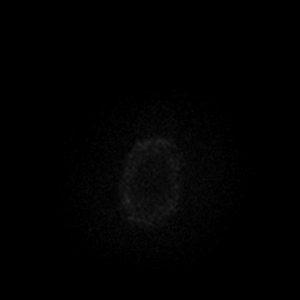

[Series 6: DWI · axial · 3.0mm · 0.78mm/px · z∈[-43,+98]mm · 3 of 48 slices shown (3 of 6)]
[im 1/48]
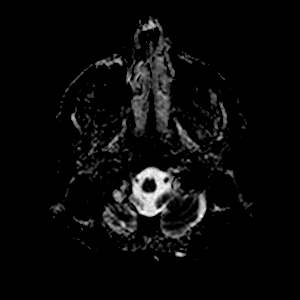
[im 24/48]
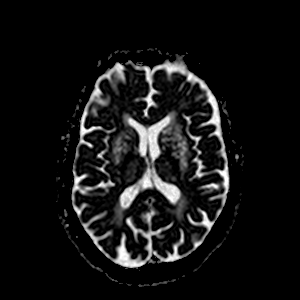
[im 48/48]
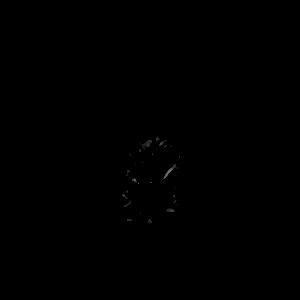

[Series 7: DWI · coronal · 5.0mm · 0.88mm/px · 2 of 29 slices shown (4 of 6)]
[im 1/29]
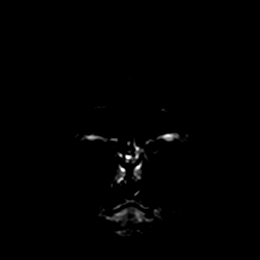
[im 29/29]
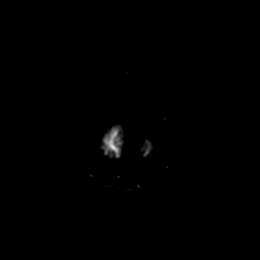

[Series 7: DWI · coronal · 5.0mm · 0.88mm/px · 2 of 29 slices shown (5 of 6)]
[im 1/29]
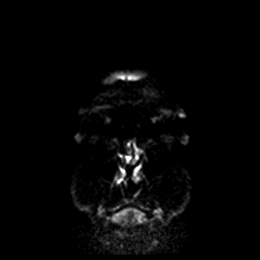
[im 29/29]
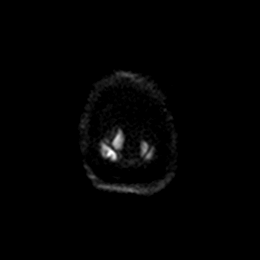

[Series 8: DWI · coronal · 5.0mm · 0.88mm/px · 2 of 29 slices shown (6 of 6)]
[im 1/29]
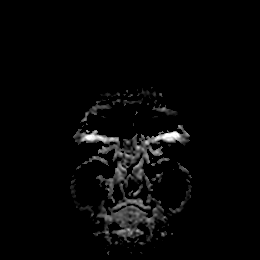
[im 29/29]
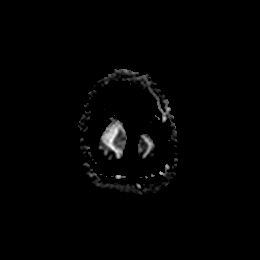

[Series 9: T1 · sagittal · 5.0mm · 0.75mm/px · 2 of 21 slices shown]
[im 1/21]
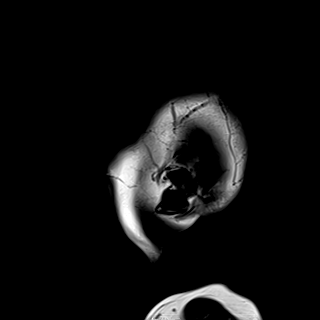
[im 21/21]
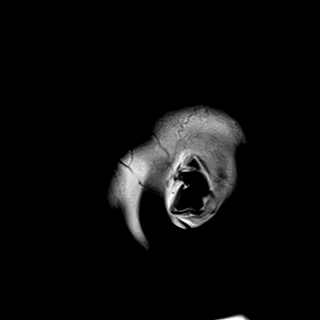

[Series 10: T2 · axial · 5.0mm · 0.73mm/px · z∈[-50,+96]mm · 2 of 22 slices shown (1 of 2)]
[im 1/22]
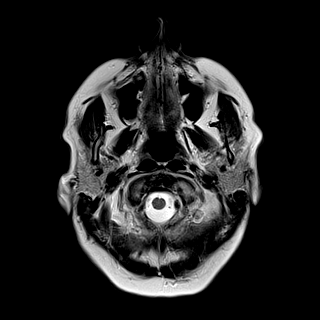
[im 22/22]
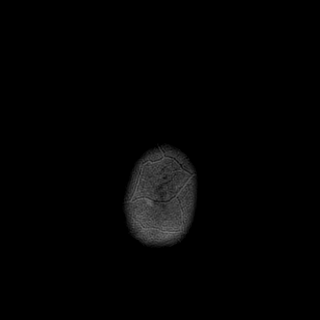

[Series 11: mag_images · axial · 3.0mm · 0.92mm/px · z∈[-56,+96]mm · 4 of 52 slices shown]
[im 1/52]
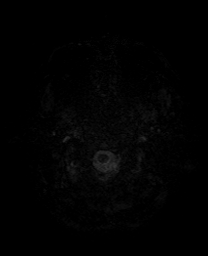
[im 18/52]
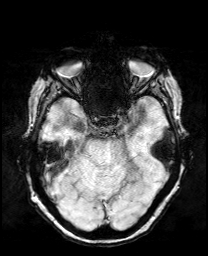
[im 35/52]
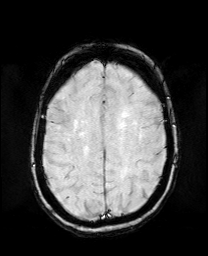
[im 52/52]
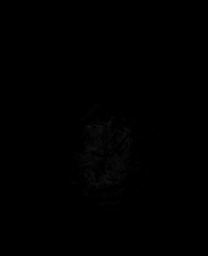

[Series 12: pha_images · axial · 3.0mm · 0.92mm/px · z∈[-56,+96]mm · 4 of 52 slices shown]
[im 1/52]
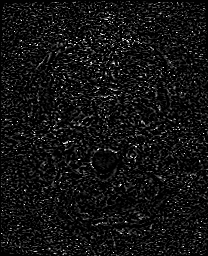
[im 18/52]
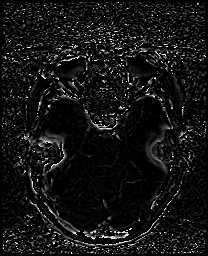
[im 35/52]
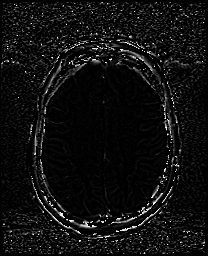
[im 52/52]
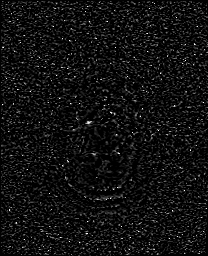

[Series 13: swi_images · axial · 3.0mm · 0.92mm/px · z∈[-56,+96]mm · 4 of 52 slices shown]
[im 1/52]
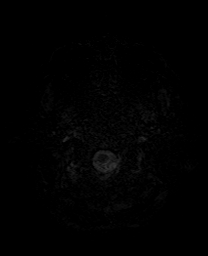
[im 18/52]
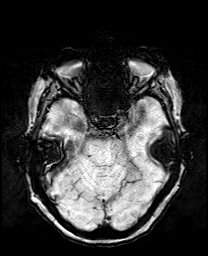
[im 35/52]
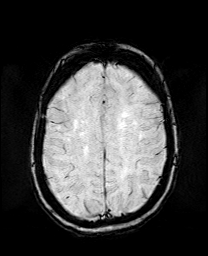
[im 52/52]
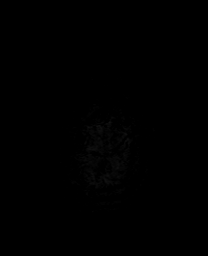

[Series 15: FLAIR · axial · 3.0mm · 0.46mm/px · z∈[-44,+93]mm · 3 of 47 slices shown]
[im 1/47]
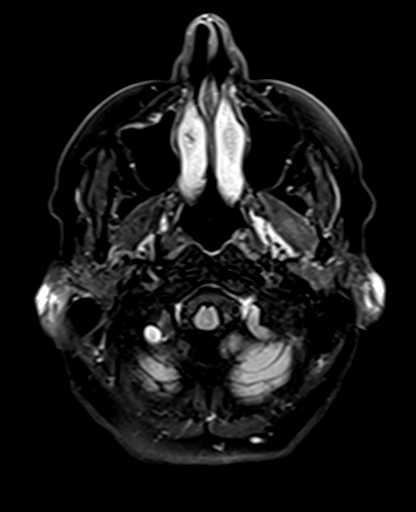
[im 24/47]
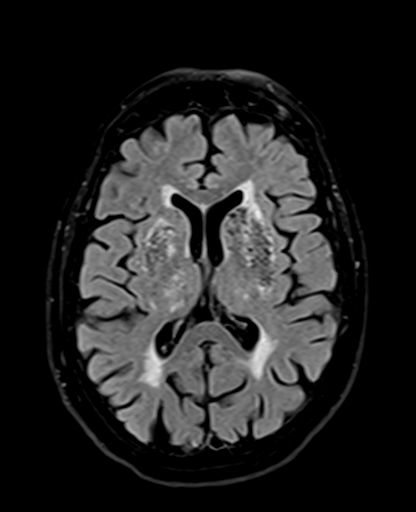
[im 47/47]
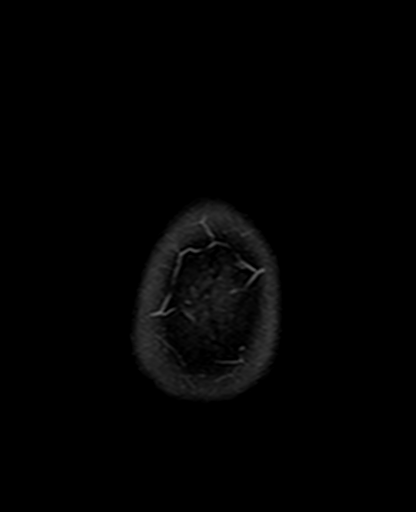

[Series 17: T2 · coronal · 5.0mm · 0.72mm/px · 2 of 30 slices shown (2 of 2)]
[im 1/30]
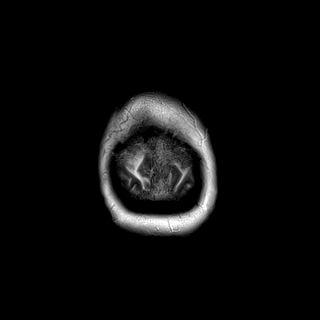
[im 30/30]
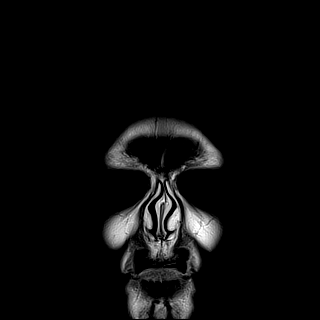

[36 of 48 positions shown; findings below may reference images not displayed]

FINDINGS: Brain:

Mild for age generalized cerebral and cerebellar atrophy.

Prominent perivascular spaces within the deep gray nuclei
bilaterally. Superimposed chronic lacunar infarcts within the
bilateral basal ganglia and thalami.

Moderate multifocal T2 FLAIR hyperintense signal abnormality within
the cerebral white matter, nonspecific but compatible chronic small
vessel ischemic disease. Mild chronic small vessel ischemic changes
are also present within the pons.

Prominent perivascular space versus chronic lacunar infarct within
the left midbrain (series 10, image 9).

There is no acute infarct.

No evidence of an intracranial mass.

No chronic intracranial blood products.

No extra-axial fluid collection.

No midline shift.

Vascular: Maintained flow voids within the proximal large arterial
vessels.

Skull and upper cervical spine: No focal suspicious marrow lesion.
Trace C3-C4 grade 1 anterolisthesis. Trace C4-C5 grade 1
retrolisthesis.

Sinuses/Orbits: Visualized orbits show no acute finding. Right lens
replacement. Mucosal thickening within the bilateral ethmoid and
left maxillary sinuses at the imaged levels.
IMPRESSION: No evidence of acute intracranial abnormality.

Chronic lacunar infarcts within the bilateral basal ganglia and
thalami. A small chronic lacunar infarct is also questioned within
the left midbrain.

Background chronic small vessel ischemic changes which are moderate
in the cerebral white matter, and mild in the pons.

Mild for age generalized parenchymal atrophy.

Mild paranasal sinus disease at the imaged levels, as described.

## 2023-05-20 NOTE — Therapy (Unsigned)
OUTPATIENT PHYSICAL THERAPY VESTIBULAR EVALUATION     Patient Name: Linda Baker MRN: [id number] DOB:1940-10-04, 83 y.o., female Today's Date: 05/21/2023  END OF SESSION:  PT End of Session - 05/21/23 1226     Visit Number 1    Number of Visits 8    Date for PT Re-Evaluation 06/20/23    Authorization Type etna medicare    PT Start Time 1100    PT Stop Time 1145    PT Time Calculation (min) 45 min    Activity Tolerance Patient tolerated treatment well    Behavior During Therapy [facility] for tasks assessed/performed             Past Medical History:  Diagnosis Date   Dyslipidemia    HTN (hypertension)    Past Surgical History:  Procedure Laterality Date   BIOPSY  12/15/2019   Procedure: BIOPSY;  Surgeon: Lanelle Bal, DO;  [facility];  Service: Endoscopy;;   COLONOSCOPY WITH PROPOFOL N/A 12/15/2019   Procedure: COLONOSCOPY WITH PROPOFOL;  Surgeon: Lanelle Bal, DO;  [facility];  Service: Endoscopy;  Laterality: N/A;  12:15pm   LAPAROSCOPIC HYSTERECTOMY     TOTAL HIP ARTHROPLASTY     Patient Active Problem List   Diagnosis Date Noted   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/2019   Adenomatous polyp 12/01/2019    PCP: Smith Robert REFERRING PROVIDER: Ardath Sax, FNP  REFERRING DIAG:  Diagnosis  R42 (ICD-10-CM) - Dizziness    THERAPY DIAG:  Diagnosis  R42 (ICD-10-CM) - Dizziness   ONSET DATE: chronic  Rationale for Evaluation and Treatment: Rehabilitation  SUBJECTIVE:   SUBJECTIVE STATEMENT: Pt states that she went to an ENT 09/25/22 and had the Epley Maneuver and she was better, however the dizziness has came back.  She has the most problem when she first lies back in bed.  Turning to LT causes dizziness  Pt accompanied by: self and significant other  PERTINENT HISTORY: Pt has had vertigo for over two years.    PAIN:  Are you having pain? No  PRECAUTIONS: Fall  RED FLAGS: None   WEIGHT BEARING RESTRICTIONS:  No  FALLS: Has patient fallen in last 6 months? Yes. Number of falls 1  LIVING ENVIRONMENT: Lives with: lives with their family  PATIENT GOALS: not to be dizzy any longer   OBJECTIVE:  Note: Objective measures were completed at Evaluation unless otherwise noted.   COGNITION: Overall cognitive status: Within functional limits for tasks assessed   SENSATION: WFL   POSTURE:  rounded shoulders and forward head  Cervical ROM:  wnl for all  BED MOBILITY:  I   FUNCTIONAL TESTS:  30 seconds chair stand test:  10  Single leg stance:  rt:  10"  lt 4"    VESTIBULAR ASSESSMENT:   SYMPTOM BEHAVIOR:  Subjective history: Worse after an MRI   Non-Vestibular symptoms: none  Type of dizziness: "World moves"  Frequency: pt having it only when she goes to bed   Duration: a minute or two   Aggravating factors:  sometimes when she bends all the time when she goes sit to supine   Relieving factors: rest  Progression of symptoms: worse  OCULOMOTOR EXAM:  Ocular Alignment: normal  Ocular ROM: No Limitations  Spontaneous Nystagmus: absent  Gaze-Induced Nystagmus: absent  Smooth Pursuits: saccades  Saccades: hypermetric/overshoots and extra eye movements VESTIBULAR - OCULAR REFLEX:   Slow VOR: Normal  VOR Cancellation: Normal  POSITIONAL TESTING: Right Dix-Hallpike: upbeating,  right nystagmus Left Dix-Hallpike: upbeating, left nystagmus  MOTION SENSITIVITY:  Motion Sensitivity Quotient Intensity: 0 = none, 1 = Lightheaded, 2 = Mild, 3 = Moderate, 4 = Severe, 5 = Vomiting  Intensity  1. Sitting to supine   2. Supine to L side   3. Supine to R side   4. Supine to sitting   5. L Hallpike-Dix 1  6. Up from L    7. R Hallpike-Dix 3  8. Up from R    9. Sitting, head tipped to L knee   10. Head up from L knee   11. Sitting, head tipped to R knee   12. Head up from R knee   13. Sitting head turns x5   14.Sitting head nods x5   15. In stance, 180 turn to L    16. In  stance, 180 turn to R     DGI 20                                                                                                                           TREATMENT DATE: 05/21/23 Evaluation. Eye motion exercises Smooth pursuit   Canalith Repositioning:  Epley Right: Number of Reps: 2 and Response to Treatment: symptoms improved and Epley Left: Number of Reps: 1 and Response to Treatment: comment: unknown unable to complete a second time due to time restraints.  Gaze Adaptation:  x1 Viewing Horizontal: Position: seated and x1 Viewing Vertical:  Position: seated Habituation: none completed on this visit.    PATIENT EDUCATION: Education details: HEP Person educated: Patient and Spouse Education method: Explanation Education comprehension: verbalized understanding and returned demonstration  HOME EXERCISE PROGRAM: Access Code: [email or web address] URL: [email or web address] Date: 05/21/2023 Prepared by: Virgina Organ  Exercises - Seated Scapular Retraction  - 3 x daily - 7 x weekly - 3 sets - 10 reps - Seated Horizontal Smooth Pursuit  - 3 x daily - 7 x weekly - 1 sets - 10 reps - Eye Stretch: Right and Left  - 2 x daily - 7 x weekly - 1 sets - 10 reps - Eye Stretch: Up and Down  - 2 x daily - 7 x weekly - 1 sets - 10 reps - Eye Stretch: Out Diagonals  - 2 x daily - 7 x weekly - 1 sets - 10 reps GOALS: Goals reviewed with patient? No  SHORT TERM GOALS: Target date: 06/04/23  Pt to no longer have any dizziness Baseline: Goal status: INITIAL  2.  Pt to be able to single leg stance on both LE for 15" for decreased risk of falls Baseline:  Goal status: INITIAL   LONG TERM GOALS: Target date: 06/19/23  Pt DGI to be 10 or below Baseline:  Goal status: INITIAL  2.  Pt to have a negative saccades  Baseline:  Goal status: INITIAL  3.  Pt to be able to single leg stance for 15 seconds on both LE  Baseline:  Goal status:  INITIAL   ASSESSMENT:  CLINICAL  IMPRESSION: Patient is a 83 y.o. female  who was seen today for physical therapy evaluation and treatment for dizziness. 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  [name] will benefit from skilled PT to address these issues to decrease her risk for falling.   OBJECTIVE IMPAIRMENTS: dizziness.   ACTIVITY LIMITATIONS: bending, dressing, and hygiene/grooming  PARTICIPATION LIMITATIONS: cleaning  REHAB POTENTIAL: Good  CLINICAL DECISION MAKING: Evolving/moderate complexity  EVALUATION COMPLEXITY: Moderate   PLAN:  PT FREQUENCY: 2x/week  PT DURATION: 4 weeks  PLANNED INTERVENTIONS: 97110-Therapeutic exercises, 97530- Therapeutic activity, [id number]- Neuromuscular re-education, 97535- Self Care, [id number]- Manual therapy, and Vestibular training  PLAN FOR NEXT SESSION: Test both Rt and LT Dix Halpike maneuver, treat as indicated.  Test roll test.  Begin balance activities as indicated.  Virgina Organ, PT CLT [phone]  05/21/2023, 12:27 PM

## 2023-05-21 ENCOUNTER — Other Ambulatory Visit: Payer: Self-pay

## 2023-05-21 ENCOUNTER — Ambulatory Visit (HOSPITAL_COMMUNITY): Payer: Medicare HMO | Attending: Nurse Practitioner | Admitting: Physical Therapy

## 2023-05-21 DIAGNOSIS — H8113 Benign paroxysmal vertigo, bilateral: Secondary | ICD-10-CM | POA: Insufficient documentation

## 2023-05-27 ENCOUNTER — Ambulatory Visit (HOSPITAL_COMMUNITY): Payer: Medicare HMO | Attending: Nurse Practitioner

## 2023-05-27 DIAGNOSIS — R262 Difficulty in walking, not elsewhere classified: Secondary | ICD-10-CM | POA: Diagnosis present

## 2023-05-27 DIAGNOSIS — R2689 Other abnormalities of gait and mobility: Secondary | ICD-10-CM | POA: Diagnosis present

## 2023-05-27 DIAGNOSIS — H8113 Benign paroxysmal vertigo, bilateral: Secondary | ICD-10-CM | POA: Insufficient documentation

## 2023-05-27 NOTE — Therapy (Signed)
 OUTPATIENT PHYSICAL THERAPY VESTIBULAR TREATMENT     Patient Name: Linda Baker MRN: [id number] DOB:[birth year], 83 y.o., female Today's Date: 05/27/2023  END OF SESSION:  PT End of Session - 05/27/23 1013     Visit Number 2    Number of Visits 8    Date for PT Re-Evaluation 06/20/23    Authorization Type [employer] medicare    PT Start Time 1014    PT Stop Time 1100    PT Time Calculation (min) 46 min    Activity Tolerance Patient tolerated treatment well    Behavior During Therapy [facility] for tasks assessed/performed             Past Medical History:  Diagnosis Date   Dyslipidemia    HTN (hypertension)    Past Surgical History:  Procedure Laterality Date   BIOPSY  12/15/2019   Procedure: BIOPSY;  Surgeon: Cindie Carlin POUR, DO;  [facility];  Service: Endoscopy;;   COLONOSCOPY WITH PROPOFOL  N/A 12/15/2019   Procedure: COLONOSCOPY WITH PROPOFOL ;  Surgeon: Cindie Carlin POUR, DO;  [facility];  Service: Endoscopy;  Laterality: N/A;  12:15pm   LAPAROSCOPIC HYSTERECTOMY     TOTAL HIP ARTHROPLASTY     Patient Active Problem List   Diagnosis Date Noted   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/2019   Adenomatous polyp 12/01/2019    PCP: Pecolia Senior REFERRING PROVIDER: Margarete Maeola DASEN, FNP  REFERRING DIAG:  Diagnosis  R42 (ICD-10-CM) - Dizziness    THERAPY DIAG:  Diagnosis  R42 (ICD-10-CM) - Dizziness   ONSET DATE: chronic  Rationale for Evaluation and Treatment: Rehabilitation  SUBJECTIVE:   SUBJECTIVE STATEMENT: She reports she is feeling a little better but does not seem to understand what exactly we are treating; better with laying down to go to bed since maneuver last visit; no more spinning   EVAL:Pt states that she went to an ENT 09/25/22 and had the Epley Maneuver and she was better, however the dizziness has came back.  She has the most problem when she first lies back in bed.  Turning to LT causes dizziness  Pt accompanied by: self and  significant other  PERTINENT HISTORY: Pt has had vertigo for over two years.   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 No  PRECAUTIONS: Fall  RED FLAGS: None   WEIGHT BEARING RESTRICTIONS: No  FALLS: Has patient fallen in last 6 months? Yes. Number of falls 1  LIVING ENVIRONMENT: Lives with: lives with their family  PATIENT GOALS: not to be dizzy any longer   OBJECTIVE:  Note: Objective measures were completed at Evaluation unless otherwise noted.   COGNITION: Overall cognitive status: Within functional limits for tasks assessed   SENSATION: WFL   POSTURE:  rounded shoulders and forward head  Cervical ROM:  wnl for all  BED MOBILITY:  I   FUNCTIONAL TESTS:  30 seconds chair stand test:  10  Single leg stance:  rt:  10  lt 4    VESTIBULAR ASSESSMENT:   SYMPTOM BEHAVIOR:  Subjective history: Worse after an MRI   Non-Vestibular symptoms: none  Type of dizziness: World moves  Frequency: pt having it only when she goes to bed   Duration: a minute or two   Aggravating factors:  sometimes when she bends all the time when she goes sit to supine   Relieving factors: rest  Progression of symptoms: worse  OCULOMOTOR EXAM:  Ocular Alignment: normal  Ocular  ROM: No Limitations  Spontaneous Nystagmus: absent  Gaze-Induced Nystagmus: absent  Smooth Pursuits: saccades  Saccades: hypermetric/overshoots and extra eye movements VESTIBULAR - OCULAR REFLEX:   Slow VOR: Normal  VOR Cancellation: Normal  POSITIONAL TESTING: Right Dix-Hallpike: upbeating, right nystagmus Left Dix-Hallpike: upbeating, left nystagmus  MOTION SENSITIVITY:  Motion Sensitivity Quotient Intensity: 0 = none, 1 = Lightheaded, 2 = Mild, 3 = Moderate, 4 = Severe, 5 = Vomiting  Intensity  1. Sitting to supine   2. Supine to L side   3. Supine to R side   4. Supine to sitting   5. L Hallpike-Dix 1  6. Up from L    7. R Hallpike-Dix 3  8. Up from R    9. Sitting, head  tipped to L knee   10. Head up from L knee   11. Sitting, head tipped to R knee   12. Head up from R knee   13. Sitting head turns x5   14.Sitting head nods x5   15. In stance, 180 turn to L    16. In stance, 180 turn to R     DGI 20                                                                                                                           TREATMENT DATE:  05/27/23 Trenda Craze right and left negative Horizontal roll test negative Review of HEP and goals Education on BPPV Seated scapular retraction 5 x 10 Standing SLS using hands to assist right leg 1 max, left leg 3 max without hands Tandem stance x 20 each Update HEP   05/21/23 Evaluation. Eye motion exercises Smooth pursuit   Canalith Repositioning:  Epley Right: Number of Reps: 2 and Response to Treatment: symptoms improved and Epley Left: Number of Reps: 1 and Response to Treatment: comment: unknown unable to complete a second time due to time restraints.  Gaze Adaptation:  x1 Viewing Horizontal: Position: seated and x1 Viewing Vertical:  Position: seated Habituation: none completed on this visit.    PATIENT EDUCATION: Education details: HEP Person educated: Patient and Spouse Education method: Explanation Education comprehension: verbalized understanding and returned demonstration  HOME EXERCISE PROGRAM: Access Code: [email or web address] URL: [email or web address] Date: 05/21/2023 Prepared by: Montie Metro  Exercises - Seated Scapular Retraction  - 3 x daily - 7 x weekly - 3 sets - 10 reps - Seated Horizontal Smooth Pursuit  - 3 x daily - 7 x weekly - 1 sets - 10 reps - Eye Stretch: Right and Left  - 2 x daily - 7 x weekly - 1 sets - 10 reps - Eye Stretch: Up and Down  - 2 x daily - 7 x weekly - 1 sets - 10 reps - Eye Stretch: Out Diagonals  - 2 x daily - 7 x weekly - 1 sets - 10 reps GOALS: Goals reviewed with patient? No  SHORT TERM GOALS: Target date: 06/04/23  Pt to no  longer  have any dizziness Baseline: Goal status: in progress   2.  Pt to be able to single leg stance on both LE for 10 for decreased risk of falls Baseline:  Goal status: in progress   LONG TERM GOALS: Target date: 06/19/23  Pt DGI to be 10 or below Baseline:  Goal status: in progress   2.  Pt to have a negative saccades  Baseline:  Goal status: in progress   3.  Pt to be able to single leg stance for 15 seconds on both LE  Baseline:  Goal status: in progress   ASSESSMENT:  CLINICAL IMPRESSION: PT reviewed with patient HEP and goals.  Patient verbalizes agreement with set rehab goals.  Retest of posterior canals right and left both negative; testing for horizontal canal negative.  Patient states she does not understand what BPPV so spent some time educating patient on inner ear anatomy and cause/treatment of BPPV; issued educational handouts.  Progressed balance activities today; patient with noted mild swelling right knee from TKA in October and has more challenge with R SLS than Left.   Updated HEP.  Patient will benefit from continued skilled therapy services to address deficits and promote return to optimal function.      Eval:Patient is a 83 y.o. female  who was seen today for physical therapy evaluation and treatment for dizziness. 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  Ms. Vastine will benefit from skilled PT to address these issues to decrease her risk for falling.   OBJECTIVE IMPAIRMENTS: dizziness.   ACTIVITY LIMITATIONS: bending, dressing, and hygiene/grooming  PARTICIPATION LIMITATIONS: cleaning  REHAB POTENTIAL: Good  CLINICAL DECISION MAKING: Evolving/moderate complexity  EVALUATION COMPLEXITY: Moderate   PLAN:  PT FREQUENCY: 2x/week  PT DURATION: 4 weeks  PLANNED INTERVENTIONS: 97110-Therapeutic exercises, 97530- Therapeutic activity, [id number]- Neuromuscular re-education, 97535- Self Care, [id number]- Manual therapy, and  Vestibular training  PLAN FOR NEXT SESSION:   Progress balance activities as indicated.   11:07 AM, 05/27/23 Kynzie Polgar Small Isaias Dowson MPT [facility] [place] [phone]

## 2023-06-03 ENCOUNTER — Ambulatory Visit (HOSPITAL_COMMUNITY): Payer: Medicare HMO

## 2023-06-03 DIAGNOSIS — H8113 Benign paroxysmal vertigo, bilateral: Secondary | ICD-10-CM | POA: Diagnosis not present

## 2023-06-03 NOTE — Therapy (Signed)
OUTPATIENT PHYSICAL THERAPY VESTIBULAR TREATMENT     Patient Name: Linda Baker MRN: [id number] DOB:[birth year], 83 y.o., female Today's Date: 06/03/2023  END OF SESSION:  PT End of Session - 06/03/23 1110     Visit Number 3    Number of Visits 8    Date for PT Re-Evaluation 06/20/23    Authorization Type [employer] medicare    PT Start Time 1105    PT Stop Time 1145    PT Time Calculation (min) 40 min    Activity Tolerance Patient tolerated treatment well    Behavior During Therapy [facility] for tasks assessed/performed              Past Medical History:  Diagnosis Date   Dyslipidemia    HTN (hypertension)    Past Surgical History:  Procedure Laterality Date   BIOPSY  12/15/2019   Procedure: BIOPSY;  Surgeon: Lanelle Bal, DO;  [facility];  Service: Endoscopy;;   COLONOSCOPY WITH PROPOFOL N/A 12/15/2019   Procedure: COLONOSCOPY WITH PROPOFOL;  Surgeon: Lanelle Bal, DO;  [facility];  Service: Endoscopy;  Laterality: N/A;  12:15pm   LAPAROSCOPIC HYSTERECTOMY     TOTAL HIP ARTHROPLASTY     Patient Active Problem List   Diagnosis Date Noted   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/2019   Adenomatous polyp 12/01/2019    PCP: Smith Robert REFERRING PROVIDER: Ardath Sax, FNP  REFERRING DIAG:  Diagnosis  R42 (ICD-10-CM) - Dizziness    THERAPY DIAG:  Diagnosis  R42 (ICD-10-CM) - Dizziness   ONSET DATE: chronic  Rationale for Evaluation and Treatment: Rehabilitation  SUBJECTIVE:   SUBJECTIVE STATEMENT: Patient denies having pain at the moment. Patient reports that she's not dizzy anymore when she lays on her back. Patient denies any recent episode of falls.  EVAL:Pt states that she went to an ENT 09/25/22 and had the Epley Maneuver and she was better, however the dizziness has came back.  She has the most problem when she first lies back in bed.  Turning to LT causes dizziness  Pt accompanied by: self and significant other  PERTINENT  HISTORY: Pt has had vertigo for over two years.   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 No  PRECAUTIONS: Fall  RED FLAGS: None   WEIGHT BEARING RESTRICTIONS: No  FALLS: Has patient fallen in last 6 months? Yes. Number of falls 1  LIVING ENVIRONMENT: Lives with: lives with their family  PATIENT GOALS: not to be dizzy any longer   OBJECTIVE:  Note: Objective measures were completed at Evaluation unless otherwise noted.   COGNITION: Overall cognitive status: Within functional limits for tasks assessed   SENSATION: WFL   POSTURE:  rounded shoulders and forward head  Cervical ROM:  wnl for all  BED MOBILITY:  I   FUNCTIONAL TESTS:  30 seconds chair stand test:  10  Single leg stance:  rt:  10"  lt 4"    VESTIBULAR ASSESSMENT:   SYMPTOM BEHAVIOR:  Subjective history: Worse after an MRI   Non-Vestibular symptoms: none  Type of dizziness: "World moves"  Frequency: pt having it only when she goes to bed   Duration: a minute or two   Aggravating factors:  sometimes when she bends all the time when she goes sit to supine   Relieving factors: rest  Progression of symptoms: worse  OCULOMOTOR EXAM:  Ocular Alignment: normal  Ocular ROM: No Limitations  Spontaneous Nystagmus: absent  Gaze-Induced Nystagmus: absent  Smooth Pursuits: saccades  Saccades: hypermetric/overshoots and extra eye movements VESTIBULAR - OCULAR REFLEX:   Slow VOR: Normal  VOR Cancellation: Normal  POSITIONAL TESTING: Right Dix-Hallpike: upbeating, right nystagmus Left Dix-Hallpike: upbeating, left nystagmus  MOTION SENSITIVITY:  Motion Sensitivity Quotient Intensity: 0 = none, 1 = Lightheaded, 2 = Mild, 3 = Moderate, 4 = Severe, 5 = Vomiting  Intensity  1. Sitting to supine   2. Supine to L side   3. Supine to R side   4. Supine to sitting   5. L Hallpike-Dix 1  6. Up from L    7. R Hallpike-Dix 3  8. Up from R    9. Sitting, head tipped to L knee   10. Head up from  L knee   11. Sitting, head tipped to R knee   12. Head up from R knee   13. Sitting head turns x5   14.Sitting head nods x5   15. In stance, 180 turn to L    16. In stance, 180 turn to R     DGI 20                                                                                                                           TREATMENT DATE:  06/03/23 Recumbent bike, seat 9, level 1, 5' Gastrocnemius slant board stretch x 30" x 3 Tandem stance on foam, eyes open x 30" x 2 each Head turns on various direction, feet together on foam x 1' Chop and lift while on foam, normal BOS x 10 on each Walking with horizontal head turns x 20 ft Walking with vertical head turns x 20 ft Walking around 3 cones with farmer's carry x 4 lbs x 1 round  05/27/23 Gilberto Better right and left negative Horizontal roll test negative Review of HEP and goals Education on BPPV Seated scapular retraction 5" x 10 Standing SLS using hands to assist right leg 1" max, left leg 3" max without hands Tandem stance x 20" each Update HEP   05/21/23 Evaluation. Eye motion exercises Smooth pursuit   Canalith Repositioning:  Epley Right: Number of Reps: 2 and Response to Treatment: symptoms improved and Epley Left: Number of Reps: 1 and Response to Treatment: comment: unknown unable to complete a second time due to time restraints.  Gaze Adaptation:  x1 Viewing Horizontal: Position: seated and x1 Viewing Vertical:  Position: seated Habituation: none completed on this visit.    PATIENT EDUCATION: Education details: HEP Person educated: Patient and Spouse Education method: Explanation Education comprehension: verbalized understanding and returned demonstration  HOME EXERCISE PROGRAM: Access Code: [email or web address] URL: [email or web address] Date: 05/21/2023 Prepared by: Virgina Organ  Exercises - Seated Scapular Retraction  - 3 x daily - 7 x weekly - 3 sets - 10 reps - Seated Horizontal Smooth Pursuit  - 3  x daily - 7 x weekly - 1 sets - 10 reps - Eye Stretch: Right and Left  -  2 x daily - 7 x weekly - 1 sets - 10 reps - Eye Stretch: Up and Down  - 2 x daily - 7 x weekly - 1 sets - 10 reps - Eye Stretch: Out Diagonals  - 2 x daily - 7 x weekly - 1 sets - 10 reps GOALS: Goals reviewed with patient? No  SHORT TERM GOALS: Target date: 06/04/23  Pt to no longer have any dizziness Baseline: Goal status: in progress   2.  Pt to be able to single leg stance on both LE for 10" for decreased risk of falls Baseline:  Goal status: in progress   LONG TERM GOALS: Target date: 06/19/23  Pt DGI to be 10 or below Baseline:  Goal status: in progress   2.  Pt to have a negative saccades  Baseline:  Goal status: in progress   3.  Pt to be able to single leg stance for 15 seconds on both LE  Baseline:  Goal status: in progress   ASSESSMENT:  CLINICAL IMPRESSION: Interventions today were geared towards LE strengthening, flexibility, and balance. Tolerated all activities without worsening of symptoms. Slight unsteadiness noted when walking with head turns. Demonstrated appropriate levels of fatigue. Rest periods provided. Provided slight amount of cueing to ensure correct execution of activity with good carry-over. To date, skilled PT is required to address the impairments and improve function.    Eval:Patient is a 83 y.o. female  who was seen today for physical therapy evaluation and treatment for dizziness. 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  Ms. Garzon will benefit from skilled PT to address these issues to decrease her risk for falling.   OBJECTIVE IMPAIRMENTS: dizziness.   ACTIVITY LIMITATIONS: bending, dressing, and hygiene/grooming  PARTICIPATION LIMITATIONS: cleaning  REHAB POTENTIAL: Good  CLINICAL DECISION MAKING: Evolving/moderate complexity  EVALUATION COMPLEXITY: Moderate   PLAN:  PT FREQUENCY: 2x/week  PT DURATION: 4  weeks  PLANNED INTERVENTIONS: 97110-Therapeutic exercises, 97530- Therapeutic activity, [id number]- Neuromuscular re-education, 97535- Self Care, 16109- Manual therapy, and Vestibular training  PLAN FOR NEXT SESSION:   Progress balance activities as indicated.   Tish Frederickson. Maripaz Mullan, PT, DPT, OCS Board-Certified Clinical Specialist in Orthopedic PT PT Compact Privilege # ([place]): [phone] T 11:33 AM, 06/03/23

## 2023-06-05 ENCOUNTER — Ambulatory Visit (HOSPITAL_COMMUNITY): Payer: Medicare HMO

## 2023-06-05 DIAGNOSIS — H8113 Benign paroxysmal vertigo, bilateral: Secondary | ICD-10-CM | POA: Diagnosis not present

## 2023-06-05 NOTE — Therapy (Signed)
OUTPATIENT PHYSICAL THERAPY VESTIBULAR TREATMENT     Patient Name: Linda Baker MRN: [id number] DOB:1940/06/16, 83 y.o., female Today's Date: 06/05/2023  END OF SESSION:  PT End of Session - 06/05/23 1021     Visit Number 4    Number of Visits 8    Date for PT Re-Evaluation 06/20/23    Authorization Type [employer] medicare    PT Start Time 1020    PT Stop Time 1100    PT Time Calculation (min) 40 min    Activity Tolerance Patient tolerated treatment well    Behavior During Therapy [facility] for tasks assessed/performed               Past Medical History:  Diagnosis Date   Dyslipidemia    HTN (hypertension)    Past Surgical History:  Procedure Laterality Date   BIOPSY  12/15/2019   Procedure: BIOPSY;  Surgeon: Lanelle Bal, DO;  [facility];  Service: Endoscopy;;   COLONOSCOPY WITH PROPOFOL N/A 12/15/2019   Procedure: COLONOSCOPY WITH PROPOFOL;  Surgeon: Lanelle Bal, DO;  [facility];  Service: Endoscopy;  Laterality: N/A;  12:15pm   LAPAROSCOPIC HYSTERECTOMY     TOTAL HIP ARTHROPLASTY     Patient Active Problem List   Diagnosis Date Noted   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  She has the most problem when she first lies back in bed.  Turning to LT causes dizziness  Pt accompanied by: self and significant other  PERTINENT HISTORY: Pt has had vertigo for over two years.   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 No  PRECAUTIONS: Fall  RED FLAGS: None   WEIGHT BEARING RESTRICTIONS: No  FALLS: Has patient fallen in last 6 months? Yes. Number of falls 1  LIVING ENVIRONMENT: Lives with: lives with their family  PATIENT GOALS: not to be dizzy any longer   OBJECTIVE:  Note: Objective measures were completed at Evaluation unless otherwise noted.   COGNITION: Overall cognitive status: Within functional limits for tasks assessed   SENSATION: WFL   POSTURE:  rounded shoulders and forward head  Cervical ROM:  wnl for all  BED MOBILITY:  I   FUNCTIONAL TESTS:  30 seconds chair stand test:  10  Single leg stance:  rt:  10"  lt 4"    VESTIBULAR ASSESSMENT:   SYMPTOM BEHAVIOR:  Subjective history: Worse after an MRI   Non-Vestibular symptoms: none  Type of dizziness: "World moves"  Frequency: pt having it only when she goes to bed   Duration: a minute or two   Aggravating factors:  sometimes when she bends all the time when she goes sit to supine   Relieving factors: rest  Progression of symptoms: worse  OCULOMOTOR EXAM:  Ocular Alignment: normal  Ocular ROM: No Limitations  Spontaneous Nystagmus: absent  Gaze-Induced Nystagmus: absent  Smooth Pursuits: saccades  Saccades: hypermetric/overshoots and extra  eye movements VESTIBULAR - OCULAR REFLEX:   Slow VOR: Normal  VOR Cancellation: Normal  POSITIONAL TESTING: Right Dix-Hallpike: upbeating, right nystagmus Left Dix-Hallpike: upbeating, left nystagmus  MOTION SENSITIVITY:  Motion Sensitivity Quotient Intensity: 0 = none, 1 = Lightheaded, 2 = Mild, 3 = Moderate, 4 = Severe, 5 = Vomiting  Intensity  1. Sitting to supine   2. Supine to L side   3. Supine to R side   4. Supine to sitting   5. L Hallpike-Dix 1  6. Up from L    7. R Hallpike-Dix 3  8. Up from R    9. Sitting, head tipped to L knee   10. Head up from L knee   11. Sitting, head tipped to R knee   12. Head up from R knee   13.  Sitting head turns x5   14.Sitting head nods x5   15. In stance, 180 turn to L    16. In stance, 180 turn to R     DGI 20                                                                                                                           TREATMENT DATE:  06/05/23 Recumbent bike, seat 9, level 2, 5' Gastrocnemius slant board stretch x 30" x 3 Tandem stance on foam, horizontal head turns x 30" on each Tandem stance on foam, vertical head turns x 30" on each Chop and lift while on foam, normal BOS x 10 on each Sit-to-stand with tidal tank on chest x 10 x 2 Tandem walks 20 ft x 2 rounds Walking with horizontal head turns with farmer's carry x 4 lbs x 20 ft Fall reaction training, ant/post x 1' total Walking with vertical head turns x 4 lbs x 20 ft Walking around 3 cones with farmer's carry x 4 lbs x 2 rounds Stepping over 4" hurdles x 3 rounds  06/03/23 Recumbent bike, seat 9, level 1, 5' Gastrocnemius slant board stretch x 30" x 3 Tandem stance on foam, eyes open x 30" x 2 each Head turns on various direction, feet together on foam x 1' Chop and lift while on foam, normal BOS x 10 on each Walking with horizontal head turns x 20 ft Walking with vertical head turns x 20 ft Walking around 3 cones with farmer's carry x 4 lbs x 1 round  05/27/23 Dix Hallpike right and left negative Horizontal roll test negative Review of HEP and goals Education on BPPV Seated scapular retraction 5" x 10 Standing SLS using hands to assist right leg 1" max, left leg 3" max without hands Tandem stance x 20" each Update HEP   05/21/23 Evaluation. Eye motion exercises Smooth pursuit   Canalith Repositioning:  Epley Right: Number of Reps: 2 and Response to Treatment: symptoms improved and Epley Left: Number of Reps: 1 and Response to Treatment: comment: unknown unable to complete a second time due to time restraints.  Gaze Adaptation:  x1 Viewing Horizontal: Position: seated and x1 Viewing  Vertical:  Position: seated Habituation: none completed on this visit.    PATIENT EDUCATION: Education details: HEP Person educated: Patient and Spouse Education method: Explanation Education comprehension: verbalized understanding and returned demonstration  HOME EXERCISE PROGRAM: Access Code: [email or web address] URL: [email or web address] Date: 05/21/2023 Prepared by: Virgina Organ  Exercises - Seated Scapular Retraction  - 3 x daily - 7 x weekly - 3 sets - 10 reps - Seated Horizontal Smooth Pursuit  - 3 x daily - 7 x weekly - 1 sets - 10 reps - Eye Stretch: Right and Left  - 2 x daily - 7 x weekly - 1 sets - 10 reps - Eye Stretch: Up and Down  - 2 x daily - 7 x weekly - 1 sets - 10 reps - Eye Stretch: Out Diagonals  - 2 x daily - 7 x weekly - 1 sets - 10 reps GOALS: Goals reviewed with patient? No  SHORT TERM GOALS: Target date: 06/04/23  Pt to no longer have any dizziness Baseline: Goal status: in progress   2.  Pt to be able to single leg stance on both LE for 10" for decreased risk of falls Baseline:  Goal status: in progress   LONG TERM GOALS: Target date: 06/19/23  Pt DGI to be 10 or below Baseline:  Goal status: in progress   2.  Pt to have a negative saccades  Baseline:  Goal status: in progress   3.  Pt to be able to single leg stance for 15 seconds on both LE  Baseline:  Goal status: in progress   ASSESSMENT:  CLINICAL IMPRESSION: Interventions today were geared towards LE strengthening, flexibility, and balance. Tolerated all activities without worsening of symptoms. Slight unsteadiness noted when walking with head turns and on tandem walks. Demonstrated appropriate levels of fatigue. Rest periods provided. Provided slight amount of cueing to ensure correct execution of activity with good carry-over. To date, skilled PT is required to address the impairments and improve function.    Eval:Patient is a 83 y.o. female  who was seen today for  physical therapy evaluation and treatment for dizziness. 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  Ms. Hino will benefit from skilled PT to address these issues to decrease her risk for falling.   OBJECTIVE IMPAIRMENTS: dizziness.   ACTIVITY LIMITATIONS: bending, dressing, and hygiene/grooming  PARTICIPATION LIMITATIONS: cleaning  REHAB POTENTIAL: Good  CLINICAL DECISION MAKING: Evolving/moderate complexity  EVALUATION COMPLEXITY: Moderate   PLAN:  PT FREQUENCY: 2x/week  PT DURATION: 4 weeks  PLANNED INTERVENTIONS: 97110-Therapeutic exercises, 97530- Therapeutic activity, [id number]- Neuromuscular re-education, 97535- Self Care, 16109- Manual therapy, and Vestibular training  PLAN FOR NEXT SESSION:   Progress balance activities as indicated.   Tish Frederickson. Daylyn Azbill, PT, DPT, OCS Board-Certified Clinical Specialist in Orthopedic PT PT Compact Privilege # ([place]): [phone] T 10:37 AM, 06/05/23

## 2023-06-09 ENCOUNTER — Encounter (HOSPITAL_COMMUNITY): Payer: Medicare HMO | Admitting: Physical Therapy

## 2023-06-11 ENCOUNTER — Encounter (HOSPITAL_COMMUNITY): Payer: Medicare HMO | Admitting: Physical Therapy

## 2023-06-12 ENCOUNTER — Ambulatory Visit (HOSPITAL_COMMUNITY): Payer: Medicare HMO

## 2023-06-12 DIAGNOSIS — H8113 Benign paroxysmal vertigo, bilateral: Secondary | ICD-10-CM

## 2023-06-12 NOTE — Therapy (Signed)
OUTPATIENT PHYSICAL THERAPY VESTIBULAR TREATMENT     Patient Name: Linda Baker MRN: [id number] DOB:1940/09/24, 83 y.o., female Today's Date: 06/12/2023  END OF SESSION:  PT End of Session - 06/12/23 1110     Visit Number 5    Number of Visits 8    Date for PT Re-Evaluation 06/20/23    Authorization Type [employer] medicare    PT Start Time 1105    PT Stop Time 1145    PT Time Calculation (min) 40 min    Activity Tolerance Patient tolerated treatment well    Behavior During Therapy [facility] for tasks assessed/performed            Past Medical History:  Diagnosis Date   Dyslipidemia    HTN (hypertension)    Past Surgical History:  Procedure Laterality Date   BIOPSY  12/15/2019   Procedure: BIOPSY;  Surgeon: Lanelle Bal, DO;  [facility];  Service: Endoscopy;;   COLONOSCOPY WITH PROPOFOL N/A 12/15/2019   Procedure: COLONOSCOPY WITH PROPOFOL;  Surgeon: Lanelle Bal, DO;  [facility];  Service: Endoscopy;  Laterality: N/A;  12:15pm   LAPAROSCOPIC HYSTERECTOMY     TOTAL HIP ARTHROPLASTY     Patient Active Problem List   Diagnosis Date Noted   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/2019   Adenomatous polyp 12/01/2019    PCP: Smith Robert REFERRING PROVIDER: Ardath Sax, FNP  REFERRING DIAG:  Diagnosis  R42 (ICD-10-CM) - Dizziness    THERAPY DIAG:  Diagnosis  R42 (ICD-10-CM) - Dizziness   ONSET DATE: chronic  Rationale for Evaluation and Treatment: Rehabilitation  SUBJECTIVE:   SUBJECTIVE STATEMENT: Patient is doing well today and denies recent falls or episodes of dizziness. However, patient reports of some neck pain = 2/10. Patient denies any trauma. Attributes the pain to sleeping wrong.  EVAL:Pt states that she went to an ENT 09/25/22 and had the Epley Maneuver and she was better, however the dizziness has came back.  She has the most problem when she first lies back in bed.  Turning to LT causes dizziness  Pt accompanied by: self  and significant other  PERTINENT HISTORY: Pt has had vertigo for over two years.   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 No  PRECAUTIONS: Fall  RED FLAGS: None   WEIGHT BEARING RESTRICTIONS: No  FALLS: Has patient fallen in last 6 months? Yes. Number of falls 1  LIVING ENVIRONMENT: Lives with: lives with their family  PATIENT GOALS: not to be dizzy any longer   OBJECTIVE:  Note: Objective measures were completed at Evaluation unless otherwise noted.   COGNITION: Overall cognitive status: Within functional limits for tasks assessed   SENSATION: WFL   POSTURE:  rounded shoulders and forward head  Cervical ROM:  wnl for all  BED MOBILITY:  I   FUNCTIONAL TESTS:  30 seconds chair stand test:  10  Single leg stance:  rt:  10"  lt 4"    VESTIBULAR ASSESSMENT:   SYMPTOM BEHAVIOR:  Subjective history: Worse after an MRI   Non-Vestibular symptoms: none  Type of dizziness: "World moves"  Frequency: pt having it only when she goes to bed   Duration: a minute or two   Aggravating factors:  sometimes when she bends all the time when she goes sit to supine   Relieving factors: rest  Progression of symptoms: worse  OCULOMOTOR EXAM:  Ocular Alignment: normal  Ocular ROM: No Limitations  Spontaneous Nystagmus: absent  Gaze-Induced Nystagmus: absent  Smooth Pursuits: saccades  Saccades: hypermetric/overshoots and extra eye movements VESTIBULAR - OCULAR REFLEX:   Slow VOR: Normal  VOR Cancellation: Normal  POSITIONAL TESTING: Right Dix-Hallpike: upbeating, right nystagmus Left Dix-Hallpike: upbeating, left nystagmus  MOTION SENSITIVITY:  Motion Sensitivity Quotient Intensity: 0 = none, 1 = Lightheaded, 2 = Mild, 3 = Moderate, 4 = Severe, 5 = Vomiting  Intensity  1. Sitting to supine   2. Supine to L side   3. Supine to R side   4. Supine to sitting   5. L Hallpike-Dix 1  6. Up from L    7. R Hallpike-Dix 3  8. Up from R    9. Sitting, head  tipped to L knee   10. Head up from L knee   11. Sitting, head tipped to R knee   12. Head up from R knee   13. Sitting head turns x5   14.Sitting head nods x5   15. In stance, 180 turn to L    16. In stance, 180 turn to R     DGI 20                                                                                                                           TREATMENT DATE:  06/12/23 Recumbent bike, seat 9, level 3, 5' Gastrocnemius slant board stretch x 30" x 3 Cone tapping/reaching for cones with color matching while standing on foam x 1' x 2 Sit-to-stand with tidal tank on chest x 10 x 2 Tandem walks on foam x 3 rounds Bodycraft resisted walking front/back/sides x 2 plates  x 3 rounds each Picking up and matching playing cards on the wall x 5' Walking around 3 cones with farmer's carry x 4 lbs x 2 rounds   06/05/23 Recumbent bike, seat 9, level 2, 5' Gastrocnemius slant board stretch x 30" x 3 Tandem stance on foam, horizontal head turns x 30" on each Tandem stance on foam, vertical head turns x 30" on each Chop and lift while on foam, normal BOS x 10 on each Sit-to-stand with tidal tank on chest x 10 x 2 Tandem walks 20 ft x 2 rounds Walking with horizontal head turns with farmer's carry x 4 lbs x 20 ft Fall reaction training, ant/post x 1' total Walking with vertical head turns x 4 lbs x 20 ft Walking around 3 cones with farmer's carry x 4 lbs x 2 rounds Stepping over 4" hurdles x 3 rounds  06/03/23 Recumbent bike, seat 9, level 1, 5' Gastrocnemius slant board stretch x 30" x 3 Tandem stance on foam, eyes open x 30" x 2 each Head turns on various direction, feet together on foam x 1' Chop and lift while on foam, normal BOS x 10 on each Walking with horizontal head turns x 20 ft Walking with vertical head turns x 20 ft Walking around 3 cones with farmer's carry x 4  lbs x 1 round  05/27/23 Gilberto Better right and left negative Horizontal roll test negative Review of HEP  and goals Education on BPPV Seated scapular retraction 5" x 10 Standing SLS using hands to assist right leg 1" max, left leg 3" max without hands Tandem stance x 20" each Update HEP   05/21/23 Evaluation. Eye motion exercises Smooth pursuit   Canalith Repositioning:  Epley Right: Number of Reps: 2 and Response to Treatment: symptoms improved and Epley Left: Number of Reps: 1 and Response to Treatment: comment: unknown unable to complete a second time due to time restraints.  Gaze Adaptation:  x1 Viewing Horizontal: Position: seated and x1 Viewing Vertical:  Position: seated Habituation: none completed on this visit.    PATIENT EDUCATION: Education details: HEP Person educated: Patient and Spouse Education method: Explanation Education comprehension: verbalized understanding and returned demonstration  HOME EXERCISE PROGRAM: Access Code: [email or web address] URL: [email or web address] Date: 05/21/2023 Prepared by: Virgina Organ  Exercises - Seated Scapular Retraction  - 3 x daily - 7 x weekly - 3 sets - 10 reps - Seated Horizontal Smooth Pursuit  - 3 x daily - 7 x weekly - 1 sets - 10 reps - Eye Stretch: Right and Left  - 2 x daily - 7 x weekly - 1 sets - 10 reps - Eye Stretch: Up and Down  - 2 x daily - 7 x weekly - 1 sets - 10 reps - Eye Stretch: Out Diagonals  - 2 x daily - 7 x weekly - 1 sets - 10 reps GOALS: Goals reviewed with patient? No  SHORT TERM GOALS: Target date: 06/04/23  Pt to no longer have any dizziness Baseline: Goal status: in progress   2.  Pt to be able to single leg stance on both LE for 10" for decreased risk of falls Baseline:  Goal status: in progress   LONG TERM GOALS: Target date: 06/19/23  Pt DGI to be 10 or below Baseline:  Goal status: in progress   2.  Pt to have a negative saccades  Baseline:  Goal status: in progress   3.  Pt to be able to single leg stance for 15 seconds on both LE  Baseline:  Goal status: in  progress   ASSESSMENT:  CLINICAL IMPRESSION: Interventions today were geared towards LE strengthening, flexibility, and balance. Tolerated all activities without worsening of symptoms. Slight unsteadiness noted when walking on foam and when picking up cards. Demonstrated appropriate levels of fatigue. Rest periods provided. Provided slight amount of cueing to ensure correct execution of activity with good carry-over. To date, skilled PT is required to address the impairments and improve function.    Eval:Patient is a 83 y.o. female  who was seen today for physical therapy evaluation and treatment for dizziness. 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  Ms. Principato will benefit from skilled PT to address these issues to decrease her risk for falling.   OBJECTIVE IMPAIRMENTS: dizziness.   ACTIVITY LIMITATIONS: bending, dressing, and hygiene/grooming  PARTICIPATION LIMITATIONS: cleaning  REHAB POTENTIAL: Good  CLINICAL DECISION MAKING: Evolving/moderate complexity  EVALUATION COMPLEXITY: Moderate   PLAN:  PT FREQUENCY: 2x/week  PT DURATION: 4 weeks  PLANNED INTERVENTIONS: 97110-Therapeutic exercises, 97530- Therapeutic activity, [id number]- Neuromuscular re-education, 97535- Self Care, 16109- Manual therapy, and Vestibular training  PLAN FOR NEXT SESSION:   Progress balance activities as indicated.   Tish Frederickson. Annjanette Wertenberger, PT, DPT, OCS Board-Certified Clinical Specialist in Orthopedic PT PT Compact Privilege # (  [place]): [phone] T 11:50 AM, 06/12/23

## 2023-06-16 ENCOUNTER — Ambulatory Visit (HOSPITAL_COMMUNITY): Payer: Medicare HMO

## 2023-06-16 DIAGNOSIS — H8113 Benign paroxysmal vertigo, bilateral: Secondary | ICD-10-CM

## 2023-06-16 DIAGNOSIS — R262 Difficulty in walking, not elsewhere classified: Secondary | ICD-10-CM

## 2023-06-16 DIAGNOSIS — R2689 Other abnormalities of gait and mobility: Secondary | ICD-10-CM

## 2023-06-16 NOTE — Therapy (Signed)
 OUTPATIENT PHYSICAL THERAPY VESTIBULAR TREATMENT     Patient Name: Linda Baker MRN: [id number] DOB:07/30/1940, 83 y.o., female Today's Date: 06/16/2023  END OF SESSION:  PT End of Session - 06/16/23 1136     Visit Number 6    Number of Visits 8    Date for PT Re-Evaluation 06/20/23    Authorization Type [employer] medicare    PT Start Time 1136    PT Stop Time 1217    PT Time Calculation (min) 41 min    Activity Tolerance Patient tolerated treatment well    Behavior During Therapy [facility] for tasks assessed/performed            Past Medical History:  Diagnosis Date   Dyslipidemia    HTN (hypertension)    Past Surgical History:  Procedure Laterality Date   BIOPSY  12/15/2019   Procedure: BIOPSY;  Surgeon: Lanelle Bal, DO;  [facility];  Service: Endoscopy;;   COLONOSCOPY WITH PROPOFOL N/A 12/15/2019   Procedure: COLONOSCOPY WITH PROPOFOL;  Surgeon: Lanelle Bal, DO;  [facility];  Service: Endoscopy;  Laterality: N/A;  12:15pm   LAPAROSCOPIC HYSTERECTOMY     TOTAL HIP ARTHROPLASTY     Patient Active Problem List   Diagnosis Date Noted   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  She has the most problem when she first lies back in bed.  Turning to LT causes dizziness  Pt accompanied by: self and significant other  PERTINENT HISTORY: Pt has had vertigo for  over two years.   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 No  PRECAUTIONS: Fall  RED FLAGS: None   WEIGHT BEARING RESTRICTIONS: No  FALLS: Has patient fallen in last 6 months? Yes. Number of falls 1  LIVING ENVIRONMENT: Lives with: lives with their family  PATIENT GOALS: not to be dizzy any longer   OBJECTIVE:  Note: Objective measures were completed at Evaluation unless otherwise noted.   COGNITION: Overall cognitive status: Within functional limits for tasks assessed   SENSATION: WFL   POSTURE:  rounded shoulders and forward head  Cervical ROM:  wnl for all  BED MOBILITY:  I   FUNCTIONAL TESTS:  30 seconds chair stand test:  10  Single leg stance:  rt:  10"  lt 4"    VESTIBULAR ASSESSMENT:   SYMPTOM BEHAVIOR:  Subjective history: Worse after an MRI   Non-Vestibular symptoms: none  Type of dizziness: "World moves"  Frequency: pt having it only when she goes to bed   Duration: a minute or two   Aggravating factors:  sometimes when she bends all the time when she goes sit to supine   Relieving factors: rest  Progression of symptoms: worse  OCULOMOTOR EXAM:  Ocular Alignment: normal  Ocular ROM: No Limitations  Spontaneous Nystagmus: absent  Gaze-Induced Nystagmus: absent  Smooth  Pursuits: saccades  Saccades: hypermetric/overshoots and extra eye movements VESTIBULAR - OCULAR REFLEX:   Slow VOR: Normal  VOR Cancellation: Normal  POSITIONAL TESTING: Right Dix-Hallpike: upbeating, right nystagmus Left Dix-Hallpike: upbeating, left nystagmus  MOTION SENSITIVITY:  Motion Sensitivity Quotient Intensity: 0 = none, 1 = Lightheaded, 2 = Mild, 3 = Moderate, 4 = Severe, 5 = Vomiting  Intensity  1. Sitting to supine   2. Supine to L side   3. Supine to R side   4. Supine to sitting   5. L Hallpike-Dix 1  6. Up from L    7. R Hallpike-Dix 3  8. Up from R    9. Sitting, head tipped to L knee   10. Head up from L knee   11. Sitting, head  tipped to R knee   12. Head up from R knee   13. Sitting head turns x5   14.Sitting head nods x5   15. In stance, 180 turn to L    16. In stance, 180 turn to R     DGI 20                                                                                                                           TREATMENT DATE 06/16/23 Recumbent bike, seat 9, level 3, 5' Heel raises on incline 2 x 10 no UE assist Toe raises on decline 2 x 10 no UE assist Tidal tank sphere 10# right hand x 1 lap PT gym and left hand x 1 lap PT gym Standing on foam paloff press with red theraband 2 x 10 with CGA for safety Mini lunge front foot on foam with med ball flexion red x 10 each way Mini lunge front foot on foam with head turns and nods x 10 each way Slant board 5 x 20"  06/12/23 Recumbent bike, seat 9, level 3, 5' Gastrocnemius slant board stretch x 30" x 3 Cone tapping/reaching for cones with color matching while standing on foam x 1' x 2 Sit-to-stand with tidal tank on chest x 10 x 2 Tandem walks on foam x 3 rounds Bodycraft resisted walking front/back/sides x 2 plates  x 3 rounds each Picking up and matching playing cards on the wall x 5' Walking around 3 cones with farmer's carry x 4 lbs x 2 rounds   06/05/23 Recumbent bike, seat 9, level 2, 5' Gastrocnemius slant board stretch x 30" x 3 Tandem stance on foam, horizontal head turns x 30" on each Tandem stance on foam, vertical head turns x 30" on each Chop and lift while on foam, normal BOS x 10 on each Sit-to-stand with tidal tank on chest x 10 x 2 Tandem walks 20 ft x 2 rounds Walking with horizontal head turns with farmer's carry x 4 lbs x 20 ft Fall reaction training, ant/post x 1' total Walking with vertical head turns x 4 lbs x 20 ft Walking around 3 cones with farmer's carry x  4 lbs x 2 rounds Stepping over 4" hurdles x 3 rounds  06/03/23 Recumbent bike, seat 9, level 1, 5' Gastrocnemius slant board stretch x 30" x 3 Tandem stance on foam,  eyes open x 30" x 2 each Head turns on various direction, feet together on foam x 1' Chop and lift while on foam, normal BOS x 10 on each Walking with horizontal head turns x 20 ft Walking with vertical head turns x 20 ft Walking around 3 cones with farmer's carry x 4 lbs x 1 round  05/27/23 Gilberto Better right and left negative Horizontal roll test negative Review of HEP and goals Education on BPPV Seated scapular retraction 5" x 10 Standing SLS using hands to assist right leg 1" max, left leg 3" max without hands Tandem stance x 20" each Update HEP   05/21/23 Evaluation. Eye motion exercises Smooth pursuit   Canalith Repositioning:  Epley Right: Number of Reps: 2 and Response to Treatment: symptoms improved and Epley Left: Number of Reps: 1 and Response to Treatment: comment: unknown unable to complete a second time due to time restraints.  Gaze Adaptation:  x1 Viewing Horizontal: Position: seated and x1 Viewing Vertical:  Position: seated Habituation: none completed on this visit.    PATIENT EDUCATION: Education details: HEP Person educated: Patient and Spouse Education method: Explanation Education comprehension: verbalized understanding and returned demonstration  HOME EXERCISE PROGRAM: Access Code: [email or web address] URL: [email or web address] Date: 05/21/2023 Prepared by: Virgina Organ  Exercises - Seated Scapular Retraction  - 3 x daily - 7 x weekly - 3 sets - 10 reps - Seated Horizontal Smooth Pursuit  - 3 x daily - 7 x weekly - 1 sets - 10 reps - Eye Stretch: Right and Left  - 2 x daily - 7 x weekly - 1 sets - 10 reps - Eye Stretch: Up and Down  - 2 x daily - 7 x weekly - 1 sets - 10 reps - Eye Stretch: Out Diagonals  - 2 x daily - 7 x weekly - 1 sets - 10 reps GOALS: Goals reviewed with patient? No  SHORT TERM GOALS: Target date: 06/04/23  Pt to no longer have any dizziness Baseline: Goal status: in progress   2.  Pt to be able to single leg  stance on both LE for 10" for decreased risk of falls Baseline:  Goal status: in progress   LONG TERM GOALS: Target date: 06/19/23  Pt DGI to be 10 or below Baseline:  Goal status: in progress   2.  Pt to have a negative saccades  Baseline:  Goal status: in progress   3.  Pt to be able to single leg stance for 15 seconds on both LE  Baseline:  Goal status: in progress   ASSESSMENT:  CLINICAL IMPRESSION: Today's session geared towards LE strengthening, flexibility, and balance. Tolerated all activities without worsening of symptoms. Slight unsteadiness noted when walking on foam and when picking up cards. Demonstrated appropriate levels of fatigue. Rest periods provided. Provided slight amount of cueing to ensure correct execution of activity with good carry-over. To date, skilled PT is required to address the impairments and improve function.    Eval:Patient is a 83 y.o. female  who was seen today for physical therapy evaluation and treatment for dizziness. 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  Ms. Ha will benefit from skilled PT to address these issues to decrease her risk for falling.   OBJECTIVE IMPAIRMENTS: dizziness.  ACTIVITY LIMITATIONS: bending, dressing, and hygiene/grooming  PARTICIPATION LIMITATIONS: cleaning  REHAB POTENTIAL: Good  CLINICAL DECISION MAKING: Evolving/moderate complexity  EVALUATION COMPLEXITY: Moderate   PLAN:  PT FREQUENCY: 2x/week  PT DURATION: 4 weeks  PLANNED INTERVENTIONS: 97110-Therapeutic exercises, 97530- Therapeutic activity, [id number]- Neuromuscular re-education, 97535- Self Care, 16109- Manual therapy, and Vestibular training  PLAN FOR NEXT SESSION:   Progress balance activities as indicated.   12:17 PM, 06/16/23 Demika Langenderfer Small Noam Franzen MPT  physical therapy [place] [phone]

## 2023-06-18 ENCOUNTER — Ambulatory Visit (HOSPITAL_COMMUNITY): Payer: Medicare HMO

## 2023-06-18 DIAGNOSIS — R262 Difficulty in walking, not elsewhere classified: Secondary | ICD-10-CM

## 2023-06-18 DIAGNOSIS — H8113 Benign paroxysmal vertigo, bilateral: Secondary | ICD-10-CM | POA: Diagnosis not present

## 2023-06-18 DIAGNOSIS — R2689 Other abnormalities of gait and mobility: Secondary | ICD-10-CM

## 2023-06-18 NOTE — Therapy (Signed)
 OUTPATIENT PHYSICAL THERAPY VESTIBULAR TREATMENT     Patient Name: Linda Baker MRN: [id number] DOB:1940/12/03, 83 y.o., female Today's Date: 06/18/2023  END OF SESSION:  PT End of Session - 06/18/23 1105     Visit Number 7    Number of Visits 8    Date for PT Re-Evaluation 06/20/23    Authorization Type [employer] medicare    PT Start Time 1105    PT Stop Time 1145    PT Time Calculation (min) 40 min    Activity Tolerance Patient tolerated treatment well    Behavior During Therapy [facility] for tasks assessed/performed            Past Medical History:  Diagnosis Date   Dyslipidemia    HTN (hypertension)    Past Surgical History:  Procedure Laterality Date   BIOPSY  12/15/2019   Procedure: BIOPSY;  Surgeon: Lanelle Bal, DO;  [facility];  Service: Endoscopy;;   COLONOSCOPY WITH PROPOFOL N/A 12/15/2019   Procedure: COLONOSCOPY WITH PROPOFOL;  Surgeon: Lanelle Bal, DO;  [facility];  Service: Endoscopy;  Laterality: N/A;  12:15pm   LAPAROSCOPIC HYSTERECTOMY     TOTAL HIP ARTHROPLASTY     Patient Active Problem List   Diagnosis Date Noted   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/2019   Adenomatous polyp 12/01/2019    PCP: Smith Robert REFERRING PROVIDER: Ardath Sax, FNP  REFERRING DIAG:  Diagnosis  R42 (ICD-10-CM) - Dizziness    THERAPY DIAG:  Diagnosis  R42 (ICD-10-CM) - Dizziness   ONSET DATE: chronic  Rationale for Evaluation and Treatment: Rehabilitation  SUBJECTIVE:   SUBJECTIVE STATEMENT: Right knee is sore today " think the weather is effecting it"; otherwise is doing ok; patient  reports no dizziness  EVAL:Pt states that she went to an ENT 09/25/22 and had the Epley Maneuver and she was better, however the dizziness has came back.  She has the most problem when she first lies back in bed.  Turning to LT causes dizziness  Pt accompanied by: self and significant other  PERTINENT HISTORY: Pt has had vertigo for over two  years.   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 No  PRECAUTIONS: Fall  RED FLAGS: None   WEIGHT BEARING RESTRICTIONS: No  FALLS: Has patient fallen in last 6 months? Yes. Number of falls 1  LIVING ENVIRONMENT: Lives with: lives with their family  PATIENT GOALS: not to be dizzy any longer   OBJECTIVE:  Note: Objective measures were completed at Evaluation unless otherwise noted.   COGNITION: Overall cognitive status: Within functional limits for tasks assessed   SENSATION: WFL   POSTURE:  rounded shoulders and forward head  Cervical ROM:  wnl for all  BED MOBILITY:  I   FUNCTIONAL TESTS:  30 seconds chair stand test:  10  Single leg stance:  rt:  10"  lt 4"    VESTIBULAR ASSESSMENT:   SYMPTOM BEHAVIOR:  Subjective history: Worse after an MRI   Non-Vestibular symptoms: none  Type of dizziness: "World moves"  Frequency: pt having it only when she goes to bed   Duration: a minute or two   Aggravating factors:  sometimes when she bends all the time when she goes sit to supine   Relieving factors: rest  Progression of symptoms: worse  OCULOMOTOR EXAM:  Ocular Alignment: normal  Ocular ROM: No Limitations  Spontaneous Nystagmus: absent  Gaze-Induced Nystagmus: absent  Smooth Pursuits: saccades  Saccades: hypermetric/overshoots and extra eye movements VESTIBULAR - OCULAR REFLEX:   Slow VOR: Normal  VOR Cancellation: Normal  POSITIONAL TESTING: Right Dix-Hallpike: upbeating, right nystagmus Left Dix-Hallpike: upbeating, left nystagmus  MOTION SENSITIVITY:  Motion Sensitivity Quotient Intensity: 0 = none, 1 = Lightheaded, 2 = Mild, 3 = Moderate, 4 = Severe, 5 = Vomiting  Intensity  1. Sitting to supine   2. Supine to L side   3. Supine to R side   4. Supine to sitting   5. L Hallpike-Dix 1  6. Up from L    7. R Hallpike-Dix 3  8. Up from R    9. Sitting, head tipped to L knee   10. Head up from L knee   11. Sitting, head tipped to R  knee   12. Head up from R knee   13. Sitting head turns x5   14.Sitting head nods x5   15. In stance, 180 turn to L    16. In stance, 180 turn to R     DGI 20                                                                                                                           TREATMENT DATE 06/18/23 Recumbent bike, seat 9, level 3 x 5' Alternating Toe taps 7" step 2 x 10 Alternating lunges 7" step 2 x 10 5# KB sit to stand 2 x 10 Single leg 5# deadlift  x 8 each Hip abduction 2 x 10 Obstacle course 5# KB with blocks, cones and hurdles x 2 once in right hand and once in left   06/16/23 Recumbent bike, seat 9, level 3, 5' Heel raises on incline 2 x 10 no UE assist Toe raises on decline 2 x 10 no UE assist Tidal tank sphere 10# right hand x 1 lap PT gym and left hand x 1 lap PT gym Standing on foam paloff press with red theraband 2 x 10 with CGA for safety Mini lunge front foot on foam with med ball flexion red x 10 each way Mini lunge front foot on foam with head turns and nods x 10 each way Slant board 5 x 20"  06/12/23 Recumbent bike, seat 9, level 3, 5' Gastrocnemius slant board stretch x 30" x 3 Cone tapping/reaching for cones with color matching while standing on foam x 1' x 2 Sit-to-stand with tidal tank on chest x 10 x 2 Tandem walks on foam x 3 rounds Bodycraft resisted walking front/back/sides x 2 plates  x 3 rounds each Picking up and matching playing cards on the wall x 5' Walking around 3 cones with farmer's carry x 4 lbs x 2 rounds   06/05/23 Recumbent bike, seat 9, level 2, 5' Gastrocnemius slant board stretch x 30" x 3 Tandem stance on foam, horizontal head turns x 30" on each Tandem stance on foam, vertical head turns x 30" on each Chop and lift while on foam,  normal BOS x 10 on each Sit-to-stand with tidal tank on chest x 10 x 2 Tandem walks 20 ft x 2 rounds Walking with horizontal head turns with farmer's carry x 4 lbs x 20 ft Fall reaction  training, ant/post x 1' total Walking with vertical head turns x 4 lbs x 20 ft Walking around 3 cones with farmer's carry x 4 lbs x 2 rounds Stepping over 4" hurdles x 3 rounds  06/03/23 Recumbent bike, seat 9, level 1, 5' Gastrocnemius slant board stretch x 30" x 3 Tandem stance on foam, eyes open x 30" x 2 each Head turns on various direction, feet together on foam x 1' Chop and lift while on foam, normal BOS x 10 on each Walking with horizontal head turns x 20 ft Walking with vertical head turns x 20 ft Walking around 3 cones with farmer's carry x 4 lbs x 1 round  05/27/23 Dix Hallpike right and left negative Horizontal roll test negative Review of HEP and goals Education on BPPV Seated scapular retraction 5" x 10 Standing SLS using hands to assist right leg 1" max, left leg 3" max without hands Tandem stance x 20" each Update HEP   05/21/23 Evaluation. Eye motion exercises Smooth pursuit   Canalith Repositioning:  Epley Right: Number of Reps: 2 and Response to Treatment: symptoms improved and Epley Left: Number of Reps: 1 and Response to Treatment: comment: unknown unable to complete a second time due to time restraints.  Gaze Adaptation:  x1 Viewing Horizontal: Position: seated and x1 Viewing Vertical:  Position: seated Habituation: none completed on this visit.    PATIENT EDUCATION: Education details: HEP Person educated: Patient and Spouse Education method: Explanation Education comprehension: verbalized understanding and returned demonstration  HOME EXERCISE PROGRAM: Access Code: [email or web address] URL: [email or web address] Date: 05/21/2023 Prepared by: Virgina Organ  Exercises - Seated Scapular Retraction  - 3 x daily - 7 x weekly - 3 sets - 10 reps - Seated Horizontal Smooth Pursuit  - 3 x daily - 7 x weekly - 1 sets - 10 reps - Eye Stretch: Right and Left  - 2 x daily - 7 x weekly - 1 sets - 10 reps - Eye Stretch: Up and Down  - 2 x daily - 7 x  weekly - 1 sets - 10 reps - Eye Stretch: Out Diagonals  - 2 x daily - 7 x weekly - 1 sets - 10 reps GOALS: Goals reviewed with patient? No  SHORT TERM GOALS: Target date: 06/04/23  Pt to no longer have any dizziness Baseline: Goal status: in progress   2.  Pt to be able to single leg stance on both LE for 10" for decreased risk of falls Baseline:  Goal status: in progress   LONG TERM GOALS: Target date: 06/19/23  Pt DGI to be 10 or below Baseline:  Goal status: in progress   2.  Pt to have a negative saccades  Baseline:  Goal status: in progress   3.  Pt to be able to single leg stance for 15 seconds on both LE  Baseline:  Goal status: in progress   ASSESSMENT:  CLINICAL IMPRESSION: Today's session with continued focus on LE strengthening, flexibility, and balance. Added kettlebell activity to simulate functional lifting and sit to stand activity.  She demonstrates weakness bilateral hip abductors with single leg dead lift so added hip abduction exercise to treatment today. Patient with max challenge with alternating lunges today; otherwise no loss of balance.  No  reports of pain.   To date, skilled PT is required to address the impairments and improve function.    Eval:Patient is a 83 y.o. female  who was seen today for physical therapy evaluation and treatment for dizziness. 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  Ms. Colasanti will benefit from skilled PT to address these issues to decrease her risk for falling.   OBJECTIVE IMPAIRMENTS: dizziness.   ACTIVITY LIMITATIONS: bending, dressing, and hygiene/grooming  PARTICIPATION LIMITATIONS: cleaning  REHAB POTENTIAL: Good  CLINICAL DECISION MAKING: Evolving/moderate complexity  EVALUATION COMPLEXITY: Moderate   PLAN:  PT FREQUENCY: 2x/week  PT DURATION: 4 weeks  PLANNED INTERVENTIONS: 97110-Therapeutic exercises, 97530- Therapeutic activity, [id number]- Neuromuscular  re-education, 97535- Self Care, 16109- Manual therapy, and Vestibular training  PLAN FOR NEXT SESSION:   Progress balance activities as indicated.   11:44 AM, 06/18/23 Kashvi Prevette Small Aspyn Warnke MPT [place] [facility] [place] [phone]

## 2023-06-23 ENCOUNTER — Ambulatory Visit (HOSPITAL_COMMUNITY): Payer: Medicare HMO | Attending: Nurse Practitioner | Admitting: Physical Therapy

## 2023-06-23 DIAGNOSIS — R2689 Other abnormalities of gait and mobility: Secondary | ICD-10-CM | POA: Diagnosis present

## 2023-06-23 DIAGNOSIS — R262 Difficulty in walking, not elsewhere classified: Secondary | ICD-10-CM | POA: Diagnosis present

## 2023-06-23 DIAGNOSIS — H8113 Benign paroxysmal vertigo, bilateral: Secondary | ICD-10-CM | POA: Insufficient documentation

## 2023-06-23 NOTE — Therapy (Signed)
 OUTPATIENT PHYSICAL THERAPY VESTIBULAR TREATMENT/Discharge      Patient Name: Linda Baker MRN: [id number] DOB:01-Sep-1940, 83 y.o., female Today's Date: 06/23/2023  PHYSICAL THERAPY DISCHARGE SUMMARY  Visits from Start of Care: 8  Current functional level related to goals / functional outcomes: Dizziness has decreased significantly   Remaining deficits: Slight dizziness last night when she  rolled over   Education / Equipment: Self epley    Patient agrees to discharge. Patient goals were partially met. Patient is being discharged due to being pleased with the current functional level.  END OF SESSION:  PT End of Session - 06/23/23 1214     Visit Number 8    Number of Visits 8    Date for PT Re-Evaluation 06/20/23    Authorization Type [employer] medicare    PT Start Time 1140    PT Stop Time 1210    PT Time Calculation (min) 30 min    Activity Tolerance Patient tolerated treatment well    Behavior During Therapy WFL for tasks assessed/performed             Past Medical History:  Diagnosis Date   Dyslipidemia    HTN (hypertension)    Past Surgical History:  Procedure Laterality Date   BIOPSY  12/15/2019   Procedure: BIOPSY;  Surgeon: Lanelle Bal, DO;  [facility];  Service: Endoscopy;;   COLONOSCOPY WITH PROPOFOL N/A 12/15/2019   Procedure: COLONOSCOPY WITH PROPOFOL;  Surgeon: Lanelle Bal, DO;  [facility];  Service: Endoscopy;  Laterality: N/A;  12:15pm   LAPAROSCOPIC HYSTERECTOMY     TOTAL HIP ARTHROPLASTY     Patient Active Problem List   Diagnosis Date Noted   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/2019   Adenomatous polyp 12/01/2019    PCP: Smith Robert REFERRING PROVIDER: Ardath Sax, FNP  REFERRING DIAG:  Diagnosis  R42 (ICD-10-CM) - Dizziness    THERAPY DIAG:  Diagnosis  R42 (ICD-10-CM) - Dizziness   ONSET DATE: chronic  Rationale for Evaluation and Treatment: Rehabilitation  SUBJECTIVE:   SUBJECTIVE  STATEMENT: PT states that she has been doing well.  She has not had any moderate to severe dizziness for a while, did have just a second of dizziness yesterday rolling over.    EVAL:Pt states that she went to an ENT 09/25/22 and had the Epley Maneuver and she was better, however the dizziness has came back.  She has the most problem when she first lies back in bed.  Turning to LT causes dizziness  Pt accompanied by: self and significant other  PERTINENT HISTORY: Pt has had vertigo for over two years.   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 Baker  PRECAUTIONS: Fall  RED FLAGS: None   WEIGHT BEARING RESTRICTIONS: Baker  FALLS: Has patient fallen in last 6 months? Yes. Number of falls 1  LIVING ENVIRONMENT: Lives with: lives with their family  PATIENT GOALS: not to be dizzy any longer   OBJECTIVE:  Note: Objective measures were completed at Evaluation unless otherwise noted.   COGNITION: Overall cognitive status: Within functional limits for tasks assessed   SENSATION: WFL   POSTURE:  rounded shoulders and forward head  Cervical ROM:  wnl for all  BED MOBILITY:  I   FUNCTIONAL TESTS:  30 seconds chair stand test:  10 06/23/23:  14  in 30 seconds:  12 is good for pt age and sex 16 is excellent   Single leg  stance:  rt:  10"  lt 4"; 06/23/23: single leg stance:   RT 30" Lt 6 " (has a bad knee)    VESTIBULAR ASSESSMENT:   SYMPTOM BEHAVIOR:  Subjective history: Worse after an MRI   Non-Vestibular symptoms: none  Type of dizziness: "World moves"  Frequency: pt having it only when she goes to bed   Duration: a minute or two   Aggravating factors:  sometimes when she bends all the time when she goes sit to supine   Relieving factors: rest  Progression of symptoms: worse  OCULOMOTOR EXAM:  Ocular Alignment: normal  Ocular ROM: Baker Limitations  Spontaneous Nystagmus: absent  Gaze-Induced Nystagmus: absent  Smooth Pursuits: saccades  Saccades:  hypermetric/overshoots and extra eye movements; 06/23/23:  normal  VESTIBULAR - OCULAR REFLEX:   Slow VOR: Normal  VOR Cancellation: Normal  POSITIONAL TESTING: Right Dix-Hallpike: upbeating, right nystagmus Left Dix-Hallpike: upbeating, left nystagmus  MOTION SENSITIVITY:  Motion Sensitivity Quotient Intensity: 0 = none, 1 = Lightheaded, 2 = Mild, 3 = Moderate, 4 = Severe, 5 = Vomiting  Intensity 06/23/23  1. Sitting to supine    2. Supine to L side    3. Supine to R side    4. Supine to sitting    5. L Hallpike-Dix 1 0  6. Up from L     7. R Hallpike-Dix 3 1 second maneuver 0  8. Up from R     9. Sitting, head tipped to L knee    10. Head up from L knee    11. Sitting, head tipped to R knee    12. Head up from R knee    13. Sitting head turns x5    14.Sitting head nods x5    15. In stance, 180 turn to L     16. In stance, 180 turn to R      DGI 20                                                                                                                       TREATMENT DATE 06/23/23: Reassessed pt.  Completed Epley x 2 on RT.  Given home Eply instruction  06/18/23 Recumbent bike, seat 9, level 3 x 5' Alternating Toe taps 7" step 2 x 10 Alternating lunges 7" step 2 x 10 5# KB sit to stand 2 x 10 Single leg 5# deadlift  x 8 each Hip abduction 2 x 10 Obstacle course 5# KB with blocks, cones and hurdles x 2 once in right hand and once in left   06/16/23 Recumbent bike, seat 9, level 3, 5' Heel raises on incline 2 x 10 Baker UE assist Toe raises on decline 2 x 10 Baker UE assist Tidal tank sphere 10# right hand x 1 lap PT gym and left hand x 1 lap PT gym Standing on foam paloff press with red theraband 2 x 10 with CGA for safety Mini lunge front foot on foam with med ball  flexion red x 10 each way Mini lunge front foot on foam with head turns and nods x 10 each way Slant board 5 x 20"  06/12/23 Recumbent bike, seat 9, level 3, 5' Gastrocnemius slant board stretch x 30" x  3 Cone tapping/reaching for cones with color matching while standing on foam x 1' x 2 Sit-to-stand with tidal tank on chest x 10 x 2 Tandem walks on foam x 3 rounds Bodycraft resisted walking front/back/sides x 2 plates  x 3 rounds each Picking up and matching playing cards on the wall x 5' Walking around 3 cones with farmer's carry x 4 lbs x 2 rounds   06/05/23 Recumbent bike, seat 9, level 2, 5' Gastrocnemius slant board stretch x 30" x 3 Tandem stance on foam, horizontal head turns x 30" on each Tandem stance on foam, vertical head turns x 30" on each Chop and lift while on foam, normal BOS x 10 on each Sit-to-stand with tidal tank on chest x 10 x 2 Tandem walks 20 ft x 2 rounds Walking with horizontal head turns with farmer's carry x 4 lbs x 20 ft Fall reaction training, ant/post x 1' total Walking with vertical head turns x 4 lbs x 20 ft Walking around 3 cones with farmer's carry x 4 lbs x 2 rounds Stepping over 4" hurdles x 3 rounds  06/03/23 Recumbent bike, seat 9, level 1, 5' Gastrocnemius slant board stretch x 30" x 3 Tandem stance on foam, eyes open x 30" x 2 each Head turns on various direction, feet together on foam x 1' Chop and lift while on foam, normal BOS x 10 on each Walking with horizontal head turns x 20 ft Walking with vertical head turns x 20 ft Walking around 3 cones with farmer's carry x 4 lbs x 1 round  Canalith Repositioning:  Epley Right: Number of Reps: 2 and Response to Treatment: symptoms improved and Epley Left: Number of Reps: 1 and Response to Treatment: comment: unknown unable to complete a second time due to time restraints.  Gaze Adaptation:  x1 Viewing Horizontal: Position: seated and x1 Viewing Vertical:  Position: seated Habituation: none completed on this visit.    PATIENT EDUCATION: Education details: HEP Person educated: Patient and Spouse Education method: Explanation Education comprehension: verbalized understanding and returned  demonstration  HOME EXERCISE PROGRAM: Access Code: [email or web address] URL: [email or web address] Date: 05/21/2023 Prepared by: Virgina Organ  Exercises - Seated Scapular Retraction  - 3 x daily - 7 x weekly - 3 sets - 10 reps - Seated Horizontal Smooth Pursuit  - 3 x daily - 7 x weekly - 1 sets - 10 reps - Eye Stretch: Right and Left  - 2 x daily - 7 x weekly - 1 sets - 10 reps - Eye Stretch: Up and Down  - 2 x daily - 7 x weekly - 1 sets - 10 reps - Eye Stretch: Out Diagonals  - 2 x daily - 7 x weekly - 1 sets - 10 reps  06/23/23:  self epley GOALS: Goals reviewed with patient? Baker  SHORT TERM GOALS: Target date: 06/04/23  Pt to Baker longer have any dizziness Baseline: Goal status: in progress   2.  Pt to be able to single leg stance on both LE for 10" for decreased risk of falls Baseline:  Goal status: met on RT Lt hasOA    LONG TERM GOALS: Target date: 06/19/23  Pt DGI to be 10 or below Baseline:  Goal status: in  progress   2.  Pt to have a negative saccades  Baseline:  Goal status: met  3.  Pt to be able to single leg stance for 15 seconds on both LE  Baseline:  Goal status: wnl    ASSESSMENT:  CLINICAL IMPRESSION:  Pt reassessed.  PT had very mild positive to RT Hallpike-dik; Epley maneuver completed x 2 with Baker positive response the second time.  Negative to Left.  PT given and therapist reviewed self epley maneuver instructional sheet. Pt feels that she is ready for discharge.    OBJECTIVE IMPAIRMENTS: dizziness.   ACTIVITY LIMITATIONS: bending, dressing, and hygiene/grooming  PARTICIPATION LIMITATIONS: cleaning  REHAB POTENTIAL: Good  CLINICAL DECISION MAKING: Evolving/moderate complexity  EVALUATION COMPLEXITY: Moderate   PLAN:  PT FREQUENCY: 2x/week  PT DURATION: 4 weeks  PLANNED INTERVENTIONS: 97110-Therapeutic exercises, 97530- Therapeutic activity, [id number]- Neuromuscular re-education, 97535- Self Care, 95621- Manual therapy, and  Vestibular training  PLAN FOR NEXT SESSION:  discharge.   12:14 PM, 06/23/23 Virgina Organ, PT CLT [phone]

## 2023-12-08 ENCOUNTER — Ambulatory Visit (HOSPITAL_COMMUNITY)
Admission: RE | Admit: 2023-12-08 | Discharge: 2023-12-08 | Disposition: A | Discharge status: Home / Self Care | Source: Ambulatory Visit | Attending: Nurse Practitioner | Admitting: Nurse Practitioner

## 2023-12-08 ENCOUNTER — Other Ambulatory Visit (HOSPITAL_COMMUNITY): Payer: Self-pay | Admitting: Nurse Practitioner

## 2023-12-08 DIAGNOSIS — K828 Other specified diseases of gallbladder: Secondary | ICD-10-CM | POA: Diagnosis not present

## 2023-12-08 DIAGNOSIS — K5730 Diverticulosis of large intestine without perforation or abscess without bleeding: Secondary | ICD-10-CM | POA: Diagnosis not present

## 2023-12-08 DIAGNOSIS — R350 Frequency of micturition: Secondary | ICD-10-CM | POA: Insufficient documentation

## 2023-12-08 DIAGNOSIS — I700 Atherosclerosis of aorta: Secondary | ICD-10-CM | POA: Diagnosis not present

## 2024-01-07 NOTE — Progress Notes (Signed)
 01/08/2024 [name] [id number] 1941/03/17  Gastroenterology Office Note    Referring Provider: Pecolia Senior, MD Primary Care Physician:  Margarete Maeola DASEN, FNP  Primary GI Provider: Jinny Carmine, MD   Chief Complaint   Chief Complaint  Patient presents with   Establish Care   Abdominal Pain    On going pain for months, pain also felt in the lower back, abdominal pain in the below navel area     History of Present Illness   Linda Baker is a 83 y.o. female with PMHX of HTN, HLD, arthritis, dizziness, presenting today at the request of Pecolia Senior, MD due to abdominal pain.  Patient reports that she has been having back pain that  sometimes radiates to her lower abdomen for about 2 months. States the abdominal pain is very mild, it comes and goes. She states her main concern has been the back pain, not abdominal pain. Endorses taking Bayer back and body maybe once a week for back pain. Denies nausea/vomiting  Patient has regular bowel movements once a day. Denies constipation, diarrhea, nausea, vomiting. Denies melena or hematochezia.  Denies nausea, vomiting  Patient states that her PCP wanted her to follow-up with GI also due to gallbladder sludge/stones seen on CT.    Of note, patient reports she has bladder prolapse and has a follow-up appointment Monday with urologist.   12/08/2023 CT ABD/Pelvis without contrast:  IMPRESSION: 1. No acute localizing process in the abdomen or pelvis. 2. Gallbladder sludge versus small layering stones. 3. Sigmoid colon diverticulosis.  12/15/2019 Colonoscopy Impression:  Non-bleeding internal hemorrhoids. - Diverticulosis in the sigmoid colon. - Two 1 to 2 mm polyps in the transverse colon, removed with a jumbo cold forceps  Past Medical History:  Diagnosis Date   Dyslipidemia    HTN (hypertension)     Past Surgical History:  Procedure Laterality Date   BIOPSY  12/15/2019   Procedure: BIOPSY;  Surgeon: Cindie Carlin POUR,  DO;  Location: [facility];  Service: Endoscopy;;   COLONOSCOPY WITH PROPOFOL  N/A 12/15/2019   Procedure: COLONOSCOPY WITH PROPOFOL ;  Surgeon: Cindie Carlin POUR, DO;  Location: [facility];  Service: Endoscopy;  Laterality: N/A;  12:15pm   LAPAROSCOPIC HYSTERECTOMY     TOTAL HIP ARTHROPLASTY      Current Outpatient Medications  Medication Sig Dispense Refill   aspirin EC 81 MG tablet Take 81 mg by mouth daily. Swallow whole.     atorvastatin (LIPITOR) 20 MG tablet Take 20 mg by mouth daily.     Coenzyme Q10 (CO Q 10) 100 MG CAPS Take 100 mg by mouth daily.      lisinopril (ZESTRIL) 20 MG tablet Take 20 mg by mouth daily.     Multiple Vitamin (MULTIVITAMIN) tablet Take 1 tablet by mouth daily.     Probiotic Product (PROBIOTIC DAILY PO) Take 1 capsule by mouth daily.      meclizine  (ANTIVERT ) 25 MG tablet Take 1 tablet (25 mg total) by mouth 3 (three) times daily as needed for dizziness. (Patient not taking: Reported on 01/08/2024) 12 tablet 0   No current facility-administered medications for this visit.    Allergies as of 01/08/2024   (No Known Allergies)    Family History  Problem Relation Age of Onset   Hypertension Mother    Pancreatic cancer Father     Social History   Socioeconomic History   Marital status: Married    Spouse name: Not on file   Number  of children: Not on file   Years of education: Not on file   Highest education level: Not on file  Occupational History   Not on file  Tobacco Use   Smoking status: Never   Smokeless tobacco: Never  Substance and Sexual Activity   Alcohol use: Never   Drug use: Never   Sexual activity: Yes  Other Topics Concern   Not on file  Social History Narrative   Not on file   Social Drivers of Health   Financial Resource Strain: Not on file  Food Insecurity: Not on file  Transportation Needs: Not on file  Physical Activity: Not on file  Stress: Not on file  Social Connections: Not on file  Intimate Partner  Violence: Not on file     RELEVANT GI HISTORY, IMAGING AND LABS: CBC   12/28/2023 [facility] ALKALINE PHOSPHATASE 101 37-153 U/L N    AST 29 10-35 U/L N    ALT 24 6-29 U/L     GGT 23 3-65 U/L N   WHITE BLOOD CELL COUNT 9.2 3.8-10.8 Thousand/uL N    RED BLOOD CELL COUNT 4.58 3.80-5.10 Million/uL N    HEMOGLOBIN 14.1 11.7-15.5 g/dL N    HEMATOCRIT 57.6 64.9-54.9 % N    MCV 92.4 80.0-100.0 fL N      No results for input(s): HGB in the last 8760 hours.     No data to display            Review of Systems   All systems reviewed and negative except where noted in HPI.    Physical Exam  BP 120/79   Pulse 79   Temp 98 F (36.7 C) (Oral)   Ht 5' 2 (1.575 m)   Wt 186 lb 4 oz (84.5 kg)   SpO2 95%   BMI 34.07 kg/m  No LMP recorded. Patient has had a hysterectomy. General:   Alert and oriented. Pleasant and cooperative. Well-nourished and well-developed.  Head:  Normocephalic and atraumatic. Eyes:  Without icterus Ears:  Normal auditory acuity. Neck:  Supple; no masses or thyromegaly. Lungs:  Respirations even and unlabored.  Clear throughout to auscultation.   No wheezes, crackles, or rhonchi. No acute distress. Heart:  Regular rate and rhythm; no murmurs, clicks, rubs, or gallops. Abdomen:  Normal bowel sounds.  No bruits. Soft, non-tender and non-distended without masses, hepatosplenomegaly or hernias noted.  No guarding or rebound tenderness. Rectal:  Deferred. Msk:  Symmetrical without gross deformities. Normal posture. Extremities:  Without edema. Neurologic:  Alert and oriented x4;  grossly normal neurologically. Skin:  Intact without significant lesions or rashes. Psych:  Alert and cooperative. Normal mood and affect.   Assessment & Plan   [name] is a 83 y.o. female presenting today with lower abdominal pain and concerns for gallbladder sludge/stones on CT scan.   Lower abdominal Pain - No pain on physical exam - May be associated with  musculoskeletal pain and/or prolapsed bladder will continue to follow-up with PCP and urology.  Back Pain -Continue to follow-up with urology and PCP for back pain and prolapsed bladder.   gallbladder sludge/stones Discussed with patient that asymptomatic gallstones and/or sludge or incidental findings on CT scan, and do not require treatment. Discussed diet modifications such as weight loss, high-fiber foods, low-fat foods. No follow-up at this time with GI, only if having symptoms in the future.   Grayce Bohr, DNP, AGNP-C [facility]

## 2024-01-08 ENCOUNTER — Ambulatory Visit (INDEPENDENT_AMBULATORY_CARE_PROVIDER_SITE_OTHER): Admitting: Family Medicine

## 2024-01-08 ENCOUNTER — Encounter: Payer: Self-pay | Admitting: Family Medicine

## 2024-01-08 VITALS — BP 120/79 | HR 79 | Temp 98.0°F | Ht 62.0 in | Wt 186.2 lb

## 2024-01-08 DIAGNOSIS — R1030 Lower abdominal pain, unspecified: Secondary | ICD-10-CM

## 2024-01-08 DIAGNOSIS — M545 Low back pain, unspecified: Secondary | ICD-10-CM

## 2024-01-08 DIAGNOSIS — K8020 Calculus of gallbladder without cholecystitis without obstruction: Secondary | ICD-10-CM | POA: Diagnosis not present

## 2024-01-08 NOTE — Patient Instructions (Addendum)
 Thank you for seeing us  today. We discussed asymptomatic gallbladder sludge. Please let us  know if you develop any symptoms in the future.
